# Patient Record
Sex: Male | Born: 1960 | Hispanic: No | Marital: Married | Smoking: Never smoker
Health system: Southern US, Community
[De-identification: ages and names within clinical notes are randomized; demographics above are authoritative.]

## PROBLEM LIST (undated history)

## (undated) DIAGNOSIS — K219 Gastro-esophageal reflux disease without esophagitis: Secondary | ICD-10-CM

## (undated) DIAGNOSIS — K259 Gastric ulcer, unspecified as acute or chronic, without hemorrhage or perforation: Secondary | ICD-10-CM

## (undated) DIAGNOSIS — J189 Pneumonia, unspecified organism: Secondary | ICD-10-CM

## (undated) HISTORY — DX: Gastro-esophageal reflux disease without esophagitis: K21.9

---

## 2000-12-19 HISTORY — PX: VEIN SURGERY: SHX48

## 2002-01-23 ENCOUNTER — Ambulatory Visit (HOSPITAL_COMMUNITY): Admission: RE | Admit: 2002-01-23 | Discharge: 2002-01-23 | Payer: Self-pay | Admitting: Gastroenterology

## 2002-01-28 ENCOUNTER — Ambulatory Visit (HOSPITAL_COMMUNITY): Admission: RE | Admit: 2002-01-28 | Discharge: 2002-01-28 | Payer: Self-pay | Admitting: Gastroenterology

## 2002-01-28 ENCOUNTER — Encounter: Payer: Self-pay | Admitting: Gastroenterology

## 2008-12-26 ENCOUNTER — Ambulatory Visit (HOSPITAL_COMMUNITY): Admission: RE | Admit: 2008-12-26 | Discharge: 2008-12-26 | Payer: Self-pay | Admitting: Family Medicine

## 2011-05-06 NOTE — Procedures (Signed)
[place]  Patient:    EFFREY, DAVIDOW Visit Number: [id number] MRN: [id number]          Service Type: END Location: ENDO Attending Physician:  Charna Elizabeth Dictated by:   Anselmo Rod, M.D. Proc. Date: 01/23/02 Admit Date:  01/23/2002   CC:         Gabriel Earing, M.D.   Procedure Report  DATE OF BIRTH:  [birth year]  REFERRING PHYSICIAN:  Gabriel Earing, M.D.  PROCEDURE PERFORMED:  Esophagogastroduodenoscopy with biopsies.  ENDOSCOPIST:  Anselmo Rod, M.D.  INSTRUMENT USED:  Olympus video panendoscope.  INDICATIONS FOR PROCEDURE:  History of hematemesis for the last three days in a 50 year old Hispanic male rule out esophagitis, gastritis, Mallory-Weiss tear, etc.  PREPROCEDURE PREPARATION:  Informed consent was procured from the patient. The patient was fasted for eight hours prior to the procedure and was advised to refrain from use of all nonsteroidals prior to the procedure.  PREPROCEDURE PHYSICAL:  The patient had stable vital signs.  Neck supple. Chest clear to auscultation.  S1, S2 regular.  Abdomen soft with normal abdominal bowel sounds.  Epigastric tenderness on palpation with guarding.  No rebound, no rigidity, no hepatosplenomegaly.  DESCRIPTION OF PROCEDURE:  The patient was placed in left lateral decubitus position and sedated with 60 mg of Demerol and 6 mg of Versed intravenously. Once the patient was adequately sedated and maintained on low-flow oxygen and continuous cardiac monitoring, the Olympus video panendoscope was advanced through the mouthpiece, over the tongue, into the esophagus under direct vision.  The entire esophagus appeared normal.  The scope was then advanced into the stomach.  There was patchy gastritis seen throughout the gastric mucosa, especially in the proximal half of the stomach.  There was a large hiatal hernia appreciated on retroflexion.  No frank ulcers, masses or polyps were  seen.  The proximal small bowel appeared normal and without lesions.  IMPRESSION: 1. Patchy gastritis, no ulcers seen. 2. Large hiatal hernia. 3. No evidence of esophagitis or Mallory-Weiss tear. 4. Normal proximal small bowel. 5. CLO test done.  RECOMMENDATIONS: 1. Continue Nexium. 2. Treat with antibiotic if CLO positive. 3. Avoid all nonsteroidals including aspirin. 4. Schedule HIDA scan for the patient, rule out biliary dyskinesia. 5. Outpatient follow-up in the next 10 days. Dictated by:   Anselmo Rod, M.D. Attending Physician:  Charna Elizabeth DD:  01/23/02 TD:  01/23/02 Job: [id number]

## 2014-03-11 ENCOUNTER — Encounter (INDEPENDENT_AMBULATORY_CARE_PROVIDER_SITE_OTHER): Payer: Self-pay | Admitting: General Surgery

## 2014-03-11 ENCOUNTER — Ambulatory Visit (INDEPENDENT_AMBULATORY_CARE_PROVIDER_SITE_OTHER): Payer: Self-pay | Admitting: General Surgery

## 2014-03-11 ENCOUNTER — Telehealth (INDEPENDENT_AMBULATORY_CARE_PROVIDER_SITE_OTHER): Payer: Self-pay | Admitting: General Surgery

## 2014-03-11 VITALS — BP 108/70 | HR 71 | Temp 97.0°F | Resp 16 | Ht 68.0 in | Wt 212.8 lb

## 2014-03-11 DIAGNOSIS — K603 Anal fistula, unspecified: Secondary | ICD-10-CM

## 2014-03-11 NOTE — Telephone Encounter (Signed)
Patient met with surgery scheduling, given financial responsibilities, patient will call back to schedule

## 2014-03-11 NOTE — Progress Notes (Signed)
Patient ID: Rodney Lester, male   DOB: 06/28/1961, 53 y.o.   MRN: [id number]  No chief complaint on file.   HPI Rodney Lester is a 53 y.o. male.  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 Mayford KnifeWilliams. The patient states he's had dysuria for approximately 2 years. He is being given and problem on and off. He states that there is some purulence is expressed at times. The patient's main concern is this could be cancer.  HPI  Past Medical History  Diagnosis Date  . GERD (gastroesophageal reflux disease)     No past surgical history on file.  No family history on file.  Social History History  Substance Use Topics  . Smoking status: Never Smoker   . Smokeless tobacco: Not on file  . Alcohol Use: Not on file    No Known Allergies  Current Outpatient Prescriptions  Medication Sig Dispense Refill  . OMEPRAZOLE PO Take by mouth.      . sulfamethoxazole-trimethoprim (BACTRIM DS) 800-160 MG per tablet Take 1 tablet by mouth 2 (two) times daily.      . traMADol (ULTRAM) 50 MG tablet Take 50 mg by mouth every 6 (six) hours as needed.       No current facility-administered medications for this visit.    Review of Systems Review of Systems  Constitutional: Negative.   HENT: Negative.   Eyes: Negative.   Respiratory: Negative.   Cardiovascular: Negative.   Gastrointestinal: Negative.   Endocrine: Negative.   Neurological: Negative.     Blood pressure 108/70, pulse 71, temperature 97 F (36.1 C), temperature source Temporal, resp. rate 16, height 5\' 8"  (1.727 m), weight 212 lb 12.8 oz (96.525 kg).  Physical Exam Physical Exam  Constitutional: He is oriented to person, place, and time. He appears well-developed and well-nourished.  HENT:  Head: Normocephalic and atraumatic.  Eyes: Conjunctivae and EOM are normal. Pupils are equal, round, and reactive to light.  Neck: Normal range of motion. Neck  supple.  Cardiovascular: Normal rate, regular rhythm and normal heart sounds.   Pulmonary/Chest: Effort normal and breath sounds normal.  Abdominal: Soft. Bowel sounds are normal. He exhibits no distension and no mass. There is no tenderness. There is no rebound and no guarding.  Genitourinary:     Musculoskeletal: Normal range of motion.  Neurological: He is alert and oriented to person, place, and time.  Skin: Skin is warm and dry.    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 We'll proceed to the operating room for an exam under anesthesia possible fistulotomy possible seton placement. 2. I Discussed the procedure with the patient including the possible risks and benefits to include but not limited to: Infection, bleeding, damage to structures, possible incontinence, and possible need to return to the OR for further procedures. Patient voiced understanding and wishes to proceed.        Marigene Ehlersamirez Jr., Cleola Perryman 03/11/2014, 1:53 PM

## 2014-04-02 ENCOUNTER — Encounter (INDEPENDENT_AMBULATORY_CARE_PROVIDER_SITE_OTHER): Payer: Self-pay

## 2015-11-03 ENCOUNTER — Ambulatory Visit: Payer: Self-pay | Admitting: Gastroenterology

## 2017-11-25 ENCOUNTER — Emergency Department (HOSPITAL_COMMUNITY): Payer: Self-pay

## 2017-11-25 ENCOUNTER — Encounter (HOSPITAL_COMMUNITY): Payer: Self-pay | Admitting: Emergency Medicine

## 2017-11-25 ENCOUNTER — Emergency Department (HOSPITAL_COMMUNITY)
Admission: EM | Admit: 2017-11-25 | Discharge: 2017-11-25 | Disposition: A | Discharge status: Home / Self Care | Payer: Self-pay | Attending: Emergency Medicine | Admitting: Emergency Medicine

## 2017-11-25 DIAGNOSIS — J40 Bronchitis, not specified as acute or chronic: Secondary | ICD-10-CM

## 2017-11-25 DIAGNOSIS — R55 Syncope and collapse: Secondary | ICD-10-CM | POA: Insufficient documentation

## 2017-11-25 DIAGNOSIS — R739 Hyperglycemia, unspecified: Secondary | ICD-10-CM | POA: Insufficient documentation

## 2017-11-25 DIAGNOSIS — Z79899 Other long term (current) drug therapy: Secondary | ICD-10-CM | POA: Insufficient documentation

## 2017-11-25 DIAGNOSIS — N189 Chronic kidney disease, unspecified: Secondary | ICD-10-CM | POA: Insufficient documentation

## 2017-11-25 DIAGNOSIS — R05 Cough: Secondary | ICD-10-CM | POA: Insufficient documentation

## 2017-11-25 LAB — CBC WITH DIFFERENTIAL/PLATELET
BASOS ABS: 0 10*3/uL (ref 0.0–0.1)
BASOS PCT: 0 %
EOS ABS: 0 10*3/uL (ref 0.0–0.7)
Eosinophils Relative: 0 %
HCT: 41.3 % (ref 39.0–52.0)
HEMOGLOBIN: 14.1 g/dL (ref 13.0–17.0)
Lymphocytes Relative: 12 %
Lymphs Abs: 1.5 10*3/uL (ref 0.7–4.0)
MCH: 30 pg (ref 26.0–34.0)
MCHC: 34.1 g/dL (ref 30.0–36.0)
MCV: 87.9 fL (ref 78.0–100.0)
Monocytes Absolute: 1.2 10*3/uL — ABNORMAL HIGH (ref 0.1–1.0)
Monocytes Relative: 10 %
NEUTROS PCT: 78 %
Neutro Abs: 9.5 10*3/uL — ABNORMAL HIGH (ref 1.7–7.7)
Platelets: 299 10*3/uL (ref 150–400)
RBC: 4.7 MIL/uL (ref 4.22–5.81)
RDW: 12.7 % (ref 11.5–15.5)
WBC: 12.2 10*3/uL — AB (ref 4.0–10.5)

## 2017-11-25 LAB — BASIC METABOLIC PANEL
ANION GAP: 14 (ref 5–15)
BUN: 15 mg/dL (ref 6–20)
CALCIUM: 9 mg/dL (ref 8.9–10.3)
CO2: 20 mmol/L — ABNORMAL LOW (ref 22–32)
CREATININE: 1.26 mg/dL — AB (ref 0.61–1.24)
Chloride: 103 mmol/L (ref 101–111)
GFR calc non Af Amer: >60 mL/min (ref >60)
Glucose, Bld: 167 mg/dL — ABNORMAL HIGH (ref 65–99)
Potassium: 3.7 mmol/L (ref 3.5–5.1)
SODIUM: 137 mmol/L (ref 135–145)

## 2017-11-25 LAB — I-STAT TROPONIN, ED: Troponin i, poc: 0 ng/mL (ref 0.00–0.08)

## 2017-11-25 LAB — D-DIMER, QUANTITATIVE: D-Dimer, Quant: 0.53 ug/mL-FEU — ABNORMAL HIGH (ref 0.00–0.50)

## 2017-11-25 MED ORDER — ALBUTEROL SULFATE HFA 108 (90 BASE) MCG/ACT IN AERS
2.0000 | INHALATION_SPRAY | Freq: Once | RESPIRATORY_TRACT | Status: AC
  Administered 2017-11-25: 2 via RESPIRATORY_TRACT
  Filled 2017-11-25: qty 6.7

## 2017-11-25 MED ORDER — IOPAMIDOL (ISOVUE-370) INJECTION 76%
INTRAVENOUS | Status: AC
  Filled 2017-11-25: qty 100

## 2017-11-25 MED ORDER — IOPAMIDOL (ISOVUE-370) INJECTION 76%
100.0000 mL | Freq: Once | INTRAVENOUS | Status: AC | PRN
  Administered 2017-11-25: 100 mL via INTRAVENOUS

## 2017-11-25 NOTE — ED Provider Notes (Signed)
7:01 PM I personally reviewed the imaging tests through PACS system I reviewed available ER/hospitalization records through the EMR  CT scan with bronchitis and possibly developing pneumonia.  Patient is only 2 days out of 10 into his Levaquin.  He will continue Levaquin.  Metered-dose inhaler for cough.  Overall well-appearing.  Discharged home in good condition.  Vital signs stable.  No hypoxia.   Azalia Bilisampos, Armonte Tortorella, MD 11/25/17 Mikle [id number]

## 2017-11-25 NOTE — ED Triage Notes (Signed)
Pt and family per interpreter, pt diagnosed with cough recently, reports that they were near this facility when pt started to cough so violently that he had LOC and emesis. Pt was transferred from EMS bay to [facility] for eval, Dr Shela CommonsJ at bedside. Pt in NAD and VSS.

## 2017-11-25 NOTE — ED Provider Notes (Signed)
[facility]-EMERGENCY DEPT Provider Note   CSN: [id number] Arrival date & time: 11/25/17  1411   History is obtained from professional medical interpreter patient speaks no [place]  History   Chief Complaint Chief Complaint  Patient presents with  . Cough  . Loss of Consciousness    HPI Rodney Lester is a 56 y.o. male.  She has been coughing for the past 15 days he has had 3 episodes of syncope with severe cough.  He complains of left anterior non-radiating chest pain with cough only.  He is presently pain-free his last episode of syncope was immediately prior to coming here.  He denies any fever.  Denies shortness of breath.  He was seen at a clinic recently prescriptions written for albuterol nebulizer solution,Cheratrussin AC levofloxacin prednisone Pantoprazole, sucralfate denies headache denies abdominal pain denies other associated symptoms  HPI  Past Medical History:  Diagnosis Date  . GERD (gastroesophageal reflux disease)     There are no active problems to display for this patient.   Past Surgical History:  Procedure Laterality Date  . VEIN SURGERY  2002       Home Medications    Prior to Admission medications   Medication Sig Start Date End Date Taking? Authorizing Provider  OMEPRAZOLE PO Take by mouth.    [provider]  sulfamethoxazole-trimethoprim (BACTRIM DS) 800-160 MG per tablet Take 1 tablet by mouth 2 (two) times daily.    [provider]  traMADol (ULTRAM) 50 MG tablet Take 50 mg by mouth every 6 (six) hours as needed.    [provider]    Family History No family history on file.  Social History Social History   Tobacco Use  . Smoking status: Never Smoker  . Smokeless tobacco: Never Used  Substance Use Topics  . Alcohol use: No    Frequency: Never  . Drug use: No     Allergies   Patient has no known allergies.   Review of Systems Review of Systems  Constitutional: Negative.     HENT: Negative.   Respiratory: Positive for cough.   Cardiovascular: Positive for chest pain.  Gastrointestinal: Negative.   Musculoskeletal: Negative.   Skin: Negative.   Neurological: Negative.   Psychiatric/Behavioral: Negative.   All other systems reviewed and are negative.    Physical Exam Updated Vital Signs BP (!) 145/71 (BP Location: Right Arm)   Pulse 86   Temp 98.1 F (36.7 C) (Oral)   Resp 12   Ht 5\' 6"  (1.676 m)   Wt 96.2 kg (212 lb)   SpO2 98%   BMI 34.22 kg/m   Physical Exam  Constitutional: He appears well-developed and well-nourished.  HENT:  Head: Normocephalic and atraumatic.  Eyes: Conjunctivae are normal. Pupils are equal, round, and reactive to light.  Neck: Neck supple. No tracheal deviation present. No thyromegaly present.  Cardiovascular: Normal rate and regular rhythm.  No murmur heard. Pulmonary/Chest: Effort normal and breath sounds normal.  Abdominal: Soft. Bowel sounds are normal. He exhibits no distension. There is no tenderness.  Musculoskeletal: Normal range of motion. He exhibits no edema or tenderness.  Neurological: He is alert. Coordination normal.  Skin: Skin is warm and dry. No rash noted.  Psychiatric: He has a normal mood and affect.  Nursing note and vitals reviewed.    ED Treatments / Results  Labs (all labs ordered are listed, but only abnormal results are displayed) Labs Reviewed  D-DIMER, QUANTITATIVE (NOT AT [facility])  BASIC  METABOLIC PANEL  CBC WITH DIFFERENTIAL/PLATELET  I-STAT TROPONIN, ED    EKG  EKG Interpretation  Date/Time:  Saturday November 25 2017 14:13:11 EST Ventricular Rate:  93 PR Interval:    QRS Duration: 116 QT Interval:  355 QTC Calculation: 442 R Axis:   60 Text Interpretation:  Sinus rhythm Incomplete right bundle branch block No old tracing to compare Confirmed by BrillionJacubowitz, Doreatha MartinSam [phone]([id number]) on 11/25/2017 2:38:52 PM     Chest x-ray reviewed by me  Radiology No results found. Results for  orders placed or performed during the hospital encounter of 11/25/17  D-dimer, quantitative (not at [facility])  Result Value Ref Range   D-Dimer, Quant 0.53 (H) 0.00 - 0.50 ug/mL-FEU  Basic metabolic panel  Result Value Ref Range   Sodium 137 135 - 145 mmol/L   Potassium 3.7 3.5 - 5.1 mmol/L   Chloride 103 101 - 111 mmol/L   CO2 20 (L) 22 - 32 mmol/L   Glucose, Bld 167 (H) 65 - 99 mg/dL   BUN 15 6 - 20 mg/dL   Creatinine, Ser 6.041.26 (H) 0.61 - 1.24 mg/dL   Calcium 9.0 8.9 - 54.010.3 mg/dL   GFR calc non Af Amer >60 >60 mL/min   GFR calc Af Amer >60 >60 mL/min   Anion gap 14 5 - 15  CBC with Differential/Platelet  Result Value Ref Range   WBC 12.2 (H) 4.0 - 10.5 K/uL   RBC 4.70 4.22 - 5.81 MIL/uL   Hemoglobin 14.1 13.0 - 17.0 g/dL   HCT 98.141.3 19.139.0 - 47.852.0 %   MCV 87.9 78.0 - 100.0 fL   MCH 30.0 26.0 - 34.0 pg   MCHC 34.1 30.0 - 36.0 g/dL   RDW 29.512.7 62.111.5 - 30.815.5 %   Platelets 299 150 - 400 K/uL   Neutrophils Relative % 78 %   Neutro Abs 9.5 (H) 1.7 - 7.7 K/uL   Lymphocytes Relative 12 %   Lymphs Abs 1.5 0.7 - 4.0 K/uL   Monocytes Relative 10 %   Monocytes Absolute 1.2 (H) 0.1 - 1.0 K/uL   Eosinophils Relative 0 %   Eosinophils Absolute 0.0 0.0 - 0.7 K/uL   Basophils Relative 0 %   Basophils Absolute 0.0 0.0 - 0.1 K/uL  I-Stat Troponin, ED (not at [facility])  Result Value Ref Range   Troponin i, poc 0.00 0.00 - 0.08 ng/mL   Comment 3           Dg Chest Port 1 View  Result Date: 11/25/2017 CLINICAL DATA:  Cough EXAM: PORTABLE CHEST 1 VIEW COMPARISON:  None. FINDINGS: There is no edema or consolidation. Heart is upper normal in size with pulmonary vascularity within normal limits. No adenopathy. No bone lesions. IMPRESSION: No edema or consolidation.  Heart upper normal in size. Electronically Signed   By: Bretta BangWilliam  Woodruff III M.D.   On: 11/25/2017 14:47   Procedures Procedures (including critical care time)  Medications Ordered in ED Medications - No data to display   Initial  Impression / Assessment and Plan / ED Course  I have reviewed the triage vital signs and the nursing notes.  Pertinent labs & imaging results that were available during my care of the patient were reviewed by me and considered in my medical decision making (see chart for details).    Low pretest clinical probability for pulmonary embolism however patient  has mildly elevated d-dimer and had syncope therefore will obtain CT angiogram of chest to rule out pulmonary embolism. Symptoms  consistent with cough syncope. Pt signed out to Dr. Patria Maneampos 440 pm  Final Clinical Impressions(s) / ED Diagnoses  Diagnoses #1 cough syncope #2 renal insufficiency #3 hyperglycemia Final diagnoses:  None    ED Discharge Orders    None       Doug SouJacubowitz, Iden Stripling, MD 11/25/17 1644

## 2017-11-25 NOTE — ED Notes (Signed)
Pt ambulates w/o difficulty to BR and back to room.

## 2019-01-26 ENCOUNTER — Observation Stay (HOSPITAL_COMMUNITY): Payer: Self-pay | Admitting: Certified Registered Nurse Anesthetist

## 2019-01-26 ENCOUNTER — Encounter (HOSPITAL_COMMUNITY): Payer: Self-pay | Admitting: Emergency Medicine

## 2019-01-26 ENCOUNTER — Observation Stay (HOSPITAL_COMMUNITY)
Admission: EM | Admit: 2019-01-26 | Discharge: 2019-01-26 | Disposition: A | Discharge status: Home / Self Care | Payer: Self-pay | Attending: Family Medicine | Admitting: Family Medicine

## 2019-01-26 ENCOUNTER — Encounter (HOSPITAL_COMMUNITY)
Admission: EM | Disposition: A | Discharge status: Home / Self Care | Payer: Self-pay | Source: Home / Self Care | Attending: Emergency Medicine

## 2019-01-26 ENCOUNTER — Other Ambulatory Visit: Payer: Self-pay

## 2019-01-26 ENCOUNTER — Emergency Department (HOSPITAL_COMMUNITY): Payer: Self-pay

## 2019-01-26 DIAGNOSIS — K2100 Gastro-esophageal reflux disease with esophagitis, without bleeding: Secondary | ICD-10-CM | POA: Diagnosis present

## 2019-01-26 DIAGNOSIS — K250 Acute gastric ulcer with hemorrhage: Secondary | ICD-10-CM

## 2019-01-26 DIAGNOSIS — R109 Unspecified abdominal pain: Secondary | ICD-10-CM | POA: Diagnosis present

## 2019-01-26 DIAGNOSIS — K449 Diaphragmatic hernia without obstruction or gangrene: Secondary | ICD-10-CM | POA: Insufficient documentation

## 2019-01-26 DIAGNOSIS — K2950 Unspecified chronic gastritis without bleeding: Secondary | ICD-10-CM | POA: Insufficient documentation

## 2019-01-26 DIAGNOSIS — Z8711 Personal history of peptic ulcer disease: Secondary | ICD-10-CM | POA: Insufficient documentation

## 2019-01-26 DIAGNOSIS — R1084 Generalized abdominal pain: Secondary | ICD-10-CM

## 2019-01-26 DIAGNOSIS — G8929 Other chronic pain: Secondary | ICD-10-CM

## 2019-01-26 DIAGNOSIS — K920 Hematemesis: Secondary | ICD-10-CM | POA: Insufficient documentation

## 2019-01-26 DIAGNOSIS — Z79899 Other long term (current) drug therapy: Secondary | ICD-10-CM | POA: Insufficient documentation

## 2019-01-26 DIAGNOSIS — R1013 Epigastric pain: Secondary | ICD-10-CM

## 2019-01-26 HISTORY — PX: ESOPHAGOGASTRODUODENOSCOPY (EGD) WITH PROPOFOL: SHX5813

## 2019-01-26 HISTORY — DX: Gastric ulcer, unspecified as acute or chronic, without hemorrhage or perforation: K25.9

## 2019-01-26 HISTORY — PX: BIOPSY: SHX5522

## 2019-01-26 LAB — COMPREHENSIVE METABOLIC PANEL
ALT: 19 U/L (ref 0–44)
ANION GAP: 14 (ref 5–15)
AST: 19 U/L (ref 15–41)
Albumin: 4 g/dL (ref 3.5–5.0)
Alkaline Phosphatase: 71 U/L (ref 38–126)
BILIRUBIN TOTAL: 0.5 mg/dL (ref 0.3–1.2)
BUN: 21 mg/dL — ABNORMAL HIGH (ref 6–20)
CALCIUM: 9.1 mg/dL (ref 8.9–10.3)
CO2: 22 mmol/L (ref 22–32)
CREATININE: 1.11 mg/dL (ref 0.61–1.24)
Chloride: 103 mmol/L (ref 98–111)
Glucose, Bld: 117 mg/dL — ABNORMAL HIGH (ref 70–99)
Potassium: 4.3 mmol/L (ref 3.5–5.1)
Sodium: 139 mmol/L (ref 135–145)
TOTAL PROTEIN: 7.2 g/dL (ref 6.5–8.1)

## 2019-01-26 LAB — CBC
HCT: 46.5 % (ref 39.0–52.0)
Hemoglobin: 15.5 g/dL (ref 13.0–17.0)
MCH: 30.5 pg (ref 26.0–34.0)
MCHC: 33.3 g/dL (ref 30.0–36.0)
MCV: 91.5 fL (ref 80.0–100.0)
PLATELETS: 266 10*3/uL (ref 150–400)
RBC: 5.08 MIL/uL (ref 4.22–5.81)
RDW: 12.7 % (ref 11.5–15.5)
WBC: 7.4 10*3/uL (ref 4.0–10.5)
nRBC: 0 % (ref 0.0–0.2)

## 2019-01-26 LAB — URINALYSIS, ROUTINE W REFLEX MICROSCOPIC
Bilirubin Urine: NEGATIVE
GLUCOSE, UA: NEGATIVE mg/dL
Hgb urine dipstick: NEGATIVE
KETONES UR: NEGATIVE mg/dL
LEUKOCYTES UA: NEGATIVE
Nitrite: NEGATIVE
PROTEIN: NEGATIVE mg/dL
Specific Gravity, Urine: 1.029 (ref 1.005–1.030)
pH: 7 (ref 5.0–8.0)

## 2019-01-26 LAB — HEMOGLOBIN AND HEMATOCRIT, BLOOD
HCT: 44 % (ref 39.0–52.0)
Hemoglobin: 14.5 g/dL (ref 13.0–17.0)

## 2019-01-26 LAB — PROTIME-INR
INR: 1.01
PROTHROMBIN TIME: 13.2 s (ref 11.4–15.2)

## 2019-01-26 LAB — LIPASE, BLOOD: Lipase: 58 U/L — ABNORMAL HIGH (ref 11–51)

## 2019-01-26 SURGERY — ESOPHAGOGASTRODUODENOSCOPY (EGD) WITH PROPOFOL
Anesthesia: Monitor Anesthesia Care

## 2019-01-26 MED ORDER — MORPHINE SULFATE (PF) 4 MG/ML IV SOLN
6.0000 mg | Freq: Once | INTRAVENOUS | Status: AC
  Administered 2019-01-26: 6 mg via INTRAVENOUS
  Filled 2019-01-26: qty 2

## 2019-01-26 MED ORDER — ONDANSETRON HCL 4 MG/2ML IJ SOLN
4.0000 mg | Freq: Three times a day (TID) | INTRAMUSCULAR | Status: DC | PRN
  Administered 2019-01-26: 4 mg via INTRAVENOUS
  Filled 2019-01-26: qty 2

## 2019-01-26 MED ORDER — ACETAMINOPHEN 325 MG PO TABS: 650.0000 mg | ORAL_TABLET | Freq: Four times a day (QID) | ORAL | 0 refills | Status: AC | PRN

## 2019-01-26 MED ORDER — PANTOPRAZOLE SODIUM 40 MG PO TBEC
40.0000 mg | DELAYED_RELEASE_TABLET | Freq: Two times a day (BID) | ORAL | Status: DC
  Administered 2019-01-26: 40 mg via ORAL
  Filled 2019-01-26: qty 1

## 2019-01-26 MED ORDER — PANTOPRAZOLE SODIUM 40 MG IV SOLR
40.0000 mg | Freq: Every day | INTRAVENOUS | Status: DC
  Administered 2019-01-26: 40 mg via INTRAVENOUS
  Filled 2019-01-26: qty 40

## 2019-01-26 MED ORDER — PROPOFOL 10 MG/ML IV BOLUS
INTRAVENOUS | Status: DC | PRN
  Administered 2019-01-26 (×3): 20 mg via INTRAVENOUS

## 2019-01-26 MED ORDER — MORPHINE SULFATE (PF) 2 MG/ML IV SOLN: 2.0000 mg | INTRAVENOUS | Status: DC | PRN

## 2019-01-26 MED ORDER — PANTOPRAZOLE SODIUM 40 MG IV SOLR
40.0000 mg | Freq: Once | INTRAVENOUS | Status: AC
  Administered 2019-01-26: 40 mg via INTRAVENOUS
  Filled 2019-01-26: qty 40

## 2019-01-26 MED ORDER — SODIUM CHLORIDE 0.9% FLUSH: 3.0000 mL | INTRAVENOUS | Status: DC | PRN

## 2019-01-26 MED ORDER — LACTATED RINGERS IV SOLN
INTRAVENOUS | Status: DC | PRN
  Administered 2019-01-26: 10:00:00 via INTRAVENOUS

## 2019-01-26 MED ORDER — MORPHINE SULFATE (PF) 4 MG/ML IV SOLN: 4.0000 mg | Freq: Once | INTRAVENOUS | Status: DC

## 2019-01-26 MED ORDER — DEXTROSE-NACL 5-0.45 % IV SOLN
INTRAVENOUS | Status: DC
  Administered 2019-01-26: 13:00:00 via INTRAVENOUS

## 2019-01-26 MED ORDER — ALBUTEROL SULFATE (2.5 MG/3ML) 0.083% IN NEBU: 2.5000 mg | INHALATION_SOLUTION | RESPIRATORY_TRACT | Status: DC | PRN

## 2019-01-26 MED ORDER — ACETAMINOPHEN 325 MG PO TABS: 650.0000 mg | ORAL_TABLET | Freq: Four times a day (QID) | ORAL | Status: DC | PRN

## 2019-01-26 MED ORDER — SODIUM CHLORIDE 0.9 % IV SOLN: 250.0000 mL | INTRAVENOUS | Status: DC | PRN

## 2019-01-26 MED ORDER — LIDOCAINE VISCOUS HCL 2 % MT SOLN
15.0000 mL | Freq: Once | OROMUCOSAL | Status: AC
  Administered 2019-01-26: 15 mL via ORAL
  Filled 2019-01-26: qty 15

## 2019-01-26 MED ORDER — OXYCODONE HCL 5 MG PO TABS
5.0000 mg | ORAL_TABLET | ORAL | Status: DC | PRN
  Administered 2019-01-26: 5 mg via ORAL
  Filled 2019-01-26: qty 1

## 2019-01-26 MED ORDER — TRAZODONE HCL 50 MG PO TABS: 50.0000 mg | ORAL_TABLET | Freq: Every evening | ORAL | Status: DC | PRN

## 2019-01-26 MED ORDER — ONDANSETRON HCL 4 MG PO TABS: 4.0000 mg | ORAL_TABLET | Freq: Four times a day (QID) | ORAL | 0 refills | Status: AC | PRN

## 2019-01-26 MED ORDER — IOHEXOL 300 MG/ML  SOLN
100.0000 mL | Freq: Once | INTRAMUSCULAR | Status: AC | PRN
  Administered 2019-01-26: 100 mL via INTRAVENOUS

## 2019-01-26 MED ORDER — PANTOPRAZOLE SODIUM 40 MG PO TBEC: 40.0000 mg | DELAYED_RELEASE_TABLET | Freq: Every day | ORAL | Status: DC

## 2019-01-26 MED ORDER — ACETAMINOPHEN 650 MG RE SUPP: 650.0000 mg | Freq: Four times a day (QID) | RECTAL | Status: DC | PRN

## 2019-01-26 MED ORDER — ONDANSETRON HCL 4 MG/2ML IJ SOLN: 4.0000 mg | Freq: Four times a day (QID) | INTRAMUSCULAR | Status: DC | PRN

## 2019-01-26 MED ORDER — METOCLOPRAMIDE HCL 5 MG/ML IJ SOLN: 10.0000 mg | Freq: Once | INTRAMUSCULAR | Status: DC

## 2019-01-26 MED ORDER — DEXTROSE-NACL 5-0.45 % IV SOLN
INTRAVENOUS | Status: DC
  Administered 2019-01-26: 08:00:00 via INTRAVENOUS

## 2019-01-26 MED ORDER — ALUM & MAG HYDROXIDE-SIMETH 200-200-20 MG/5ML PO SUSP
30.0000 mL | Freq: Once | ORAL | Status: AC
  Administered 2019-01-26: 30 mL via ORAL
  Filled 2019-01-26: qty 30

## 2019-01-26 MED ORDER — PROPOFOL 500 MG/50ML IV EMUL
INTRAVENOUS | Status: DC | PRN
  Administered 2019-01-26: 100 ug/kg/min via INTRAVENOUS

## 2019-01-26 MED ORDER — PANTOPRAZOLE SODIUM 40 MG IV SOLR: 40.0000 mg | Freq: Two times a day (BID) | INTRAVENOUS | Status: DC

## 2019-01-26 MED ORDER — SODIUM CHLORIDE 0.9% FLUSH: 3.0000 mL | Freq: Two times a day (BID) | INTRAVENOUS | Status: DC

## 2019-01-26 MED ORDER — ONDANSETRON HCL 4 MG PO TABS: 4.0000 mg | ORAL_TABLET | Freq: Four times a day (QID) | ORAL | Status: DC | PRN

## 2019-01-26 MED ORDER — ONDANSETRON HCL 4 MG/2ML IJ SOLN
4.0000 mg | Freq: Once | INTRAMUSCULAR | Status: AC
  Administered 2019-01-26: 4 mg via INTRAVENOUS
  Filled 2019-01-26: qty 2

## 2019-01-26 MED ORDER — SODIUM CHLORIDE 0.9% FLUSH
3.0000 mL | Freq: Once | INTRAVENOUS | Status: AC
  Administered 2019-01-26: 3 mL via INTRAVENOUS

## 2019-01-26 MED ORDER — DIPHENHYDRAMINE HCL 50 MG/ML IJ SOLN: 25.0000 mg | Freq: Once | INTRAMUSCULAR | Status: DC

## 2019-01-26 MED ORDER — CALCIUM CARBONATE ANTACID 500 MG PO CHEW: 1.0000 | CHEWABLE_TABLET | Freq: Two times a day (BID) | ORAL | 3 refills | Status: AC | PRN

## 2019-01-26 MED ORDER — PANTOPRAZOLE SODIUM 40 MG PO TBEC: 40.0000 mg | DELAYED_RELEASE_TABLET | Freq: Two times a day (BID) | ORAL | 4 refills | Status: AC

## 2019-01-26 MED ORDER — POLYETHYLENE GLYCOL 3350 17 G PO PACK: 17.0000 g | PACK | Freq: Every day | ORAL | Status: DC | PRN

## 2019-01-26 MED ORDER — SODIUM CHLORIDE 0.9 % IV SOLN: INTRAVENOUS | Status: DC

## 2019-01-26 SURGICAL SUPPLY — 15 items

## 2019-01-26 NOTE — Progress Notes (Signed)
Patient discharged to home. Discharge paperwork reviewed with patient and patient's daughter. Verbalizes understanding of all discharge instructions including discharge medications and reasons to follow up with the doctor. AVS provided to patient in spanish.

## 2019-01-26 NOTE — Transfer of Care (Signed)
Immediate Anesthesia Transfer of Care Note  Patient: Rodney Lester  Procedure(s) Performed: ESOPHAGOGASTRODUODENOSCOPY (EGD) WITH PROPOFOL (N/A ) BIOPSY  Patient Location: Endoscopy Unit  Anesthesia Type:MAC  Level of Consciousness: awake, alert  and oriented  Airway & Oxygen Therapy: Patient Spontanous Breathing  Post-op Assessment: Report given to RN and Post -op Vital signs reviewed and stable  Post vital signs: Reviewed and stable  Last Vitals:  Vitals Value Taken Time  BP    Temp    Pulse    Resp    SpO2      Last Pain:  Vitals:   01/26/19 0954  TempSrc: Oral  PainSc: 0-No pain         Complications: No apparent anesthesia complications

## 2019-01-26 NOTE — Progress Notes (Addendum)
Received patient from ED. Self ambulated from wheelchair to bed. Patient alert and oriented, no complaints of pain at this time. Patient oriented to room/call bell. Accompanied by daughter who speaks english. Will continue to monitor.

## 2019-01-26 NOTE — ED Notes (Signed)
Pt declining any additional pain medication at the moment. Will continue to monitor

## 2019-01-26 NOTE — Consult Note (Addendum)
Consultation  Referring Provider: [facility] Mariea Clonts/Emokpae Primary Care Physician:  Patient, No Pcp Per Primary Gastroenterologist:  none  Reason for Consultation: Nausea vomiting abdominal pain and coffee-ground emesis   HPI: Rodney Lester is a 58 y.o. Hispanic male, who speaks very little [place].  Patient was admitted through the emergency room last evening after presenting with several day history of epigastric pain which he describes as twisting in nature.  This is been associated with intermittent nausea and vomiting especially with any significant p.o. intake.  He says about 10 days ago he vomited up a large amount of material that was black.  Since then most episodes of emesis have contained smaller amounts of black material.  He denies any dysphagia or done aphasia burn or indigestion.  He has not noted any melena or hematochezia. He denies any alcohol or aspirin or NSAID use.  He believes that about 10 years ago he was admitted with similar symptoms, had an endoscopy and was found to have ulcers.  He does not remember the gastroenterologist and has not seen anybody GI as an outpatient. He did get started on OTC Protonix sometime within the past week.  Patient is otherwise generally healthy.  No known significant chronic medical problems.  Evaluation in the ER last night with CT scan of the abdomen and pelvis showing a moderate sized hiatal hernia and otherwise negative study Labs pertinent for hemoglobin of 15, and BUN 21 LFTs normal.  He has been hemodynamically stable   Past Medical History:  Diagnosis Date  . GERD (gastroesophageal reflux disease)   . Multiple gastric ulcers     Past Surgical History:  Procedure Laterality Date  . VEIN SURGERY  2002    Prior to Admission medications   Medication Sig Start Date End Date Taking? Authorizing Provider  pantoprazole (PROTONIX) 40 MG tablet Take 40 mg by mouth daily.   Yes [provider]    Current  Facility-Administered Medications  Medication Dose Route Frequency Provider Last Rate Last Dose  . dextrose 5 %-0.45 % sodium chloride infusion   Intravenous Continuous Shon HaleEmokpae, Courage, MD 125 mL/hr at 01/26/19 0756    . diphenhydrAMINE (BENADRYL) injection 25 mg  25 mg Intravenous Once Shaune PollackIsaacs, Cameron, MD      . metoCLOPramide (REGLAN) injection 10 mg  10 mg Intravenous Once Shaune PollackIsaacs, Cameron, MD      . morphine 2 MG/ML injection 2 mg  2 mg Intravenous Q4H PRN Lorretta HarpNiu, Xilin, MD      . morphine 4 MG/ML injection 4 mg  4 mg Intravenous Once Shaune PollackIsaacs, Cameron, MD      . ondansetron [facility](ZOFRAN) injection 4 mg  4 mg Intravenous Q8H PRN Lorretta HarpNiu, Xilin, MD      . pantoprazole (PROTONIX) injection 40 mg  40 mg Intravenous Daily Emokpae, Courage, MD   40 mg at 01/26/19 0800    Allergies as of 01/26/2019  . (No Known Allergies)    No family history on file.  Social History   Socioeconomic History  . Marital status: Married    Spouse name: Not on file  . Number of children: Not on file  . Years of education: Not on file  . Highest education level: Not on file  Occupational History  . Not on file  Social Needs  . Financial resource strain: Not on file  . Food insecurity:    Worry: Not on file    Inability: Not on file  . Transportation needs:  Medical: Not on file    Non-medical: Not on file  Tobacco Use  . Smoking status: Never Smoker  . Smokeless tobacco: Never Used  Substance and Sexual Activity  . Alcohol use: No    Frequency: Never  . Drug use: No  . Sexual activity: Not on file  Lifestyle  . Physical activity:    Days per week: Not on file    Minutes per session: Not on file  . Stress: Not on file  Relationships  . Social connections:    Talks on phone: Not on file    Gets together: Not on file    Attends religious service: Not on file    Active member of club or organization: Not on file    Attends meetings of clubs or organizations: Not on file    Relationship status: Not on  file  . Intimate partner violence:    Fear of current or ex partner: Not on file    Emotionally abused: Not on file    Physically abused: Not on file    Forced sexual activity: Not on file  Other Topics Concern  . Not on file  Social History Narrative  . Not on file    Review of Systems: Pertinent positive and negative review of systems were noted in the above HPI section.  All other review of systems was otherwise negative.  Physical Exam: Vital signs in last 24 hours: Temp:  [97.8 F (36.6 C)-98.2 F (36.8 C)] 97.8 F (36.6 C) (02/08 0750) Pulse Rate:  [52-63] 61 (02/08 0750) Resp:  [16-18] 17 (02/08 0750) BP: (107-129)/(76-97) 120/83 (02/08 0750) SpO2:  [95 %-100 %] 96 % (02/08 0750)   General:   Alert,  Well-developed, well-nourished, Hispanic male pleasant and cooperative in NAD.  Daughter at bedside Head:  Normocephalic and atraumatic. Eyes:  Sclera clear, no icterus.   Conjunctiva pink. Ears:  Normal auditory acuity. Nose:  No deformity, discharge,  or lesions. Mouth:  No deformity or lesions.   Neck:  Supple; no masses or thyromegaly. Lungs:  Clear throughout to auscultation.   No wheezes, crackles, or rhonchi. Heart:  Regular rate and rhythm; no murmurs, clicks, rubs,  or gallops. Abdomen:  Soft, mildly tender epigastrium, BS active,nonpalp mass or hsm.   Rectal:  Deferred  Msk:  Symmetrical without gross deformities. . Pulses:  Normal pulses noted. Extremities:  Without clubbing or edema. Neurologic:  Alert and  oriented x4;  grossly normal neurologically. Skin:  Intact without significant lesions or rashes.. Psych:  Alert and cooperative. Normal mood and affect.  Intake/Output from previous day: 02/07 0701 - 02/08 0700 In: 3 [I.V.:3] Out: -  Intake/Output this shift: No intake/output data recorded.  Lab Results: Recent Labs    01/26/19 0231  WBC 7.4  HGB 15.5  HCT 46.5  PLT 266   BMET Recent Labs    01/26/19 0231  NA 139  K 4.3  CL 103  CO2  22  GLUCOSE 117*  BUN 21*  CREATININE 1.11  CALCIUM 9.1   LFT Recent Labs    01/26/19 0231  PROT 7.2  ALBUMIN 4.0  AST 19  ALT 19  ALKPHOS 71  BILITOT 0.5   PT/INR Recent Labs    01/26/19 0315  LABPROT 13.2  INR 1.01     IMPRESSION:  #3 58 year old Latino male with 7 to 10-day history of epigastric discomfort, recurrent nausea and vomiting has been associated with coffee-ground appearing material. Patient has remote prior history of peptic  ulcer disease.  Current symptoms are concerning for recurrent peptic ulcer disease, erosive gastropathy, erosive esophagitis. Fortunately does not appear that he has had any significant blood loss normal hemoglobin on admission If ulcer disease found need to rule out H. pylori as other obvious risk factors  Plan; patient has been n.p.o. overnight. Will be scheduled for upper endoscopy with Dr. Myrtie Neither today Procedure was discussed in detail with the patient and his daughter who has interpreted for him.  We discussed indications risks benefits and he is agreeable to proceed. Start IV Protonix 40 mg twice daily IV Zofran every 6 hours as needed Serial hemoglobins  Further recommendations pending findings at EGD today.    Amy EsterwoodPA-C  01/26/2019, 8:44 AM  I have reviewed the entire case in detail with the above APP and discussed the plan in detail.  Therefore, I agree with the diagnoses recorded above. In addition,  I have personally interviewed and examined the patient and have personally reviewed any abdominal/pelvic CT scan images.  My additional thoughts are as follows:  Recent upper abdominal pain, remote history of ulcer without reports of that or documentation presently available.  Small-volume hematemesis, normal hemoglobin and hemodynamically stable.  Suspect peptic ulcer disease.  Upper endoscopy.  This was discussed with the patient, who does not speak [place], and his daughter, who does.  The benefits and risks of the  planned procedure were described in detail with the patient or (when appropriate) their health care proxy.  Risks were outlined as including, but not limited to, bleeding, infection, perforation, adverse medication reaction leading to cardiac or pulmonary decompensation, or pancreatitis (if ERCP).  The limitation of incomplete mucosal visualization was also discussed.  No guarantees or warranties were given.  Further plan to follow.  Charlie Pitter III Office:[phone]

## 2019-01-26 NOTE — ED Notes (Signed)
Pt actively vomiting and reports severe LUQ pain.

## 2019-01-26 NOTE — ED Triage Notes (Signed)
C/o sharp LUQ pain x 7 days with vomiting.  Pain worse tonight.  History of GERD and ulcers.

## 2019-01-26 NOTE — Discharge Summary (Signed)
Rodney Lester, is a 58 y.o. male  DOB [birth year]  MRN [id number].  Admission date:  01/26/2019  Admitting Physician  Lorretta Harp, MD  Discharge Date:  01/26/2019   Primary MD  Patient, No Pcp Per  Recommendations for primary care physician for things to follow:   1)Avoid ibuprofen/Advil/Aleve/Motrin/Goody Powders/Naproxen/BC powders/Meloxicam/Diclofenac/Indomethacin and other Nonsteroidal anti-inflammatory medications as these will make you more likely to bleed and can cause stomach ulcers, can also cause Kidney problems.   2)You Have gastroesophageal reflux disease--- avoid spicy food, avoid carbonated drinks, limit amount of tomato-based foods----please see #1 above   Admission Diagnosis  Abdominal pain;emesis   Discharge Diagnosis  Abdominal pain;emesis    Principal Problem:   GERD with Reflux esophagitis Active Problems:   Abdominal pain      Past Medical History:  Diagnosis Date  . GERD (gastroesophageal reflux disease)   . Multiple gastric ulcers     Past Surgical History:  Procedure Laterality Date  . VEIN SURGERY  2002       HPI  from the history and physical done on the day of admission:     Rodney Lester  is a 58 y.o. male  mostly Spanish-speaking male with history of dyspepsia, GERD and possible PUD presented to the ED with concerns about recurrent nausea and vomiting occasionally with black contents.... Symptoms have persisted for at least 10 days  Patient's daughter who speaks [place] is interpreting from [place] to [place] and vice versa  No hematochezia no melena... Post meal abdominal pain does not improve or get worse, patient reports some symptomatic relief with PPI use, No fevers or chills, no diarrhea, no sick contacts at home no dysphagia, no odynophagia  Denies excessive NSAID or alcohol use, no tobacco use  No chest pains no palpitations no dizziness no  shortness of breath no pleuritic symptoms no leg swelling or leg pain  In ED--- .   LFTs are not elevated, lipase is 58, no leukocytosis,Hemoglobin 15.5. CT scanning of abdomen/pelvis is without acute findings.    GI service was consulted, PPI advised, EGD planned     Hospital Course:    1)Recurrent emesis and epigastric/abdominal pain/GERD/reflux esophagitis-----no leukocytosis, no fevers, lipase only 58, patient denies alcohol use, LFTs not elevated----EGD from 01/26/2019  With LA Grade C reflux esophagitis AND- Medium-sized hiatal hernia--- otherwise without acute findings--- GI consult appreciated, recommends PPI twice daily for 4 weeks and then daily after that EGD biopsies pending--- see HPI section--- H&H remained stable    Discharge Condition: stable  Follow UP--- GI will call with biopsy results    Consults obtained - Gi   Diet and Activity recommendation:  As advised  Discharge Instructions   Discharge Instructions    Call MD for:  difficulty breathing, headache or visual disturbances   Complete by:  As directed    Call MD for:  persistant dizziness or light-headedness   Complete by:  As directed    Call MD for:  persistant nausea and vomiting   Complete by:  As directed    Call MD for:  severe uncontrolled pain   Complete by:  As directed    Call MD for:  temperature >100.4   Complete by:  As directed    Diet - low sodium heart healthy   Complete by:  As directed    You Have gastroesophageal reflux disease--- avoid spicy food, avoid carbonated drinks, limit amount of tomato-based foods---   Discharge instructions   Complete by:  As directed    1)Avoid ibuprofen/Advil/Aleve/Motrin/Goody Powders/Naproxen/BC powders/Meloxicam/Diclofenac/Indomethacin and other Nonsteroidal anti-inflammatory medications as these will make you more likely to bleed and can cause stomach ulcers, can also cause Kidney problems.   2)You Have gastroesophageal reflux disease--- avoid spicy  food, avoid carbonated drinks, limit amount of tomato-based foods----please see #1 above   Increase activity slowly   Complete by:  As directed         Discharge Medications     Allergies as of 01/26/2019   No Known Allergies     Medication List    TAKE these medications   acetaminophen 325 MG tablet Commonly known as:  TYLENOL Take 2 tablets (650 mg total) by mouth every 6 (six) hours as needed for mild pain (or Fever >/= 101).   calcium carbonate 500 MG chewable tablet Commonly known as:  TUMS Chew 1 tablet (200 mg of elemental calcium total) by mouth 2 (two) times daily as needed for indigestion or heartburn.   ondansetron 4 MG tablet Commonly known as:  ZOFRAN Take 1 tablet (4 mg total) by mouth every 6 (six) hours as needed for nausea.   pantoprazole 40 MG tablet Commonly known as:  PROTONIX Take 1 tablet (40 mg total) by mouth 2 (two) times daily before a meal. For 4 weeks, then once daily after that What changed:    when to take this  additional instructions       Major procedures and Radiology Reports - PLEASE review detailed and final reports for all details, in brief -   Ct Abdomen Pelvis W Contrast  Result Date: 01/26/2019 CLINICAL DATA:  Initial evaluation for acute sharp left upper quadrant pain, nausea, vomiting. EXAM: CT ABDOMEN AND PELVIS WITH CONTRAST TECHNIQUE: Multidetector CT imaging of the abdomen and pelvis was performed using the standard protocol following bolus administration of intravenous contrast. CONTRAST:  OMNIPAQUE IOHEXOL 300 MG/ML  SOLN COMPARISON:  Prior ultrasound from 12/26/2008. FINDINGS: Lower chest: Visualized lung bases are clear. Hepatobiliary: Liver demonstrates a normal contrast enhanced appearance. Gallbladder within normal limits. No biliary dilatation. Pancreas: Pancreas within normal limits. No acute peripancreatic inflammation to suggest pancreatitis. No pancreatic mass or abnormal pancreatic ductal dilatation. Spleen:  Unremarkable. Adrenals/Urinary Tract: Adrenal glands are normal. Kidneys equal in size with symmetric enhancement. No nephrolithiasis, hydronephrosis or focal enhancing renal mass. No hydroureter. Bladder within normal limits. Stomach/Bowel: Moderate sized hiatal hernia. Stomach otherwise decompressed without acute finding. No evidence for bowel obstruction. Normal appendix. No acute inflammatory changes seen about the bowels. Vascular/Lymphatic: Normal intravascular enhancement seen throughout the intra-abdominal aorta. Mesenteric vessels patent proximally. No adenopathy. Reproductive: Prostate normal. Other: No free air or fluid. Musculoskeletal: No acute osseous finding. No discrete lytic or blastic osseous lesions. IMPRESSION: No CT evidence for acute intra-abdominal or pelvic process. No findings to explain patient's symptoms identified. Electronically Signed   By: Rise Mu M.D.   On: 01/26/2019 04:57    Micro Results    No results found for this or any previous visit (from the past 240 hour(s)).  Today   Subjective    Rodney Lester today has no new complaints, abdominal pain and nausea is improved, no further emesis, no fevers. daughter at bedside, questions answered          Patient has been seen and examined prior to discharge   Objective   Blood pressure 99/63, pulse (!) 55, temperature 97.7 F (36.5 C), temperature source Oral, resp. rate 18, height 5\' 7"  (1.702 m), weight 104.3 kg, SpO2 98 %.   Intake/Output Summary (Last 24 hours) at 01/26/2019 1508 Last data filed at 01/26/2019 1023 Gross per 24 hour  Intake 103 ml  Output -  Net 103 ml    Exam Gen:- Awake Alert, no acute distress  HEENT:- [place].AT, No sclera icterus Neck-Supple Neck,No JVD,.  Lungs-  CTAB , good air movement bilaterally  CV- S1, S2 normal, regular Abd-  +ve B.Sounds, Abd Soft, mild epigastric discomfort without rebound or guarding Extremity/Skin:- No  edema,   good pulses Psych-affect  is appropriate, oriented x3 Neuro-no new focal deficits, no tremors    Data Review   CBC w Diff:  Lab Results  Component Value Date   WBC 7.4 01/26/2019   HGB 14.5 01/26/2019   HCT 44.0 01/26/2019   PLT 266 01/26/2019   LYMPHOPCT 12 11/25/2017   MONOPCT 10 11/25/2017   EOSPCT 0 11/25/2017   BASOPCT 0 11/25/2017    CMP:  Lab Results  Component Value Date   NA 139 01/26/2019   K 4.3 01/26/2019   CL 103 01/26/2019   CO2 22 01/26/2019   BUN 21 (H) 01/26/2019   CREATININE 1.11 01/26/2019   PROT 7.2 01/26/2019   ALBUMIN 4.0 01/26/2019   BILITOT 0.5 01/26/2019   ALKPHOS 71 01/26/2019   AST 19 01/26/2019   ALT 19 01/26/2019  .  Total Discharge time is about 33 minutes  Shon Haleourage Kimela Malstrom M.D on 01/26/2019 at 3:08 PM  Go to [email or web address] -  for contact info  [facility] - Office  [phone]

## 2019-01-26 NOTE — Op Note (Signed)
[facility] Patient Name: Rodney RotaDomingo Dickmann Procedure Date : 01/26/2019 MRN: [id number] Attending MD: Starr LakeHenry L. Myrtie Neitheranis , MD Date of Birth: 19-Nov-1961 CSN: [id number] Age: [id number] Admit Type: Inpatient Procedure:                Upper GI endoscopy Indications:              Epigastric abdominal pain, Hematemesis Providers:                Sherilyn CooterHenry L. Myrtie Neitheranis, MD, Margaree MackintoshHayleigh Westmoreland, RN,                            Kandice RobinsonsGuillaume Awaka, Technician Referring MD:             [facility] Medicines:                Monitored Anesthesia Care Complications:            No immediate complications. Estimated Blood Loss:     Estimated blood loss was minimal. Procedure:                Pre-Anesthesia Assessment:                           - Prior to the procedure, a History and Physical                            was performed, and patient medications and                            allergies were reviewed. The patient's tolerance of                            previous anesthesia was also reviewed. The risks                            and benefits of the procedure and the sedation                            options and risks were discussed with the patient.                            All questions were answered, and informed consent                            was obtained. Prior Anticoagulants: The patient has                            taken no previous anticoagulant or antiplatelet                            agents. ASA Grade Assessment: II - A patient with                            mild systemic disease. After reviewing the risks  and benefits, the patient was deemed in                            satisfactory condition to undergo the procedure.                           After obtaining informed consent, the endoscope was                            passed under direct vision. Throughout the                            procedure, the patient's blood pressure, pulse, and                         oxygen saturations were monitored continuously. The                            [id number]-H190 ([id number] Olympus gastroscope was                            introduced through the mouth, and advanced to the                            second part of duodenum. The upper GI endoscopy was                            accomplished without difficulty. The patient                            tolerated the procedure fairly well. Scope In: Scope Out: Findings:      LA Grade C (one or more mucosal breaks continuous between tops of 2 or       more mucosal folds, less than 75% circumference) esophagitis with no       bleeding was found at the gastroesophageal junction.      A medium-sized hiatal hernia was present.      The entire examined stomach was normal. Biopsies were taken from the       antrum with a cold forceps for histology (to rule out H pylori).      The cardia and gastric fundus were normal on retroflexion other than       hiatal hernia.      The examined duodenum was normal. Impression:               - LA Grade C reflux esophagitis.                           - Medium-sized hiatal hernia.                           - Normal stomach. Biopsied.                           - Normal examined duodenum. Recommendation:           - Return patient to hospital ward for discharge  same day.                           - Resume regular diet.                           - Use Prilosec (omeprazole) 40 mg PO BID for 4                            weeks, then decrease to 40 mg once daily.                           - Await pathology results. Procedure Code(s):        --- Professional ---                           [phone], Esophagogastroduodenoscopy, flexible,                            transoral; with biopsy, single or multiple Diagnosis Code(s):        --- Professional ---                           K21.0, Gastro-esophageal reflux disease with                             esophagitis                           K44.9, Diaphragmatic hernia without obstruction or                            gangrene                           R10.13, Epigastric pain                           K92.0, Hematemesis CPT copyright 2018 [employer]. All rights reserved. The codes documented in this report are preliminary and upon coder review may  be revised to meet current compliance requirements. Henry L. Myrtie Neither, MD 01/26/2019 10:31:10 AM This report has been signed electronically. Number of Addenda: 0

## 2019-01-26 NOTE — H&P (Signed)
Patient Demographics:    Rodney Lester, is a 58 y.o. male  MRN: [id number]   DOB - 03-Mar-1961  Admit Date - 01/26/2019  Outpatient Primary MD for the patient is Patient, No Pcp Per   Assessment & Plan:    Active Problems:   Abdominal pain     1) recurrent emesis and epigastric/abdominal pain-----no leukocytosis, no fevers, lipase only 58, patient denies alcohol use, LFTs not elevated----EGD planned for 01/26/2019  --- GI consult appreciated, recommends PPI    With History of - Reviewed by me  Past Medical History:  Diagnosis Date  . GERD (gastroesophageal reflux disease)   . Multiple gastric ulcers       Past Surgical History:  Procedure Laterality Date  . VEIN SURGERY  2002      Chief Complaint  Patient presents with  . Abdominal Pain  . Emesis      HPI:    Rodney Lester  is a 58 y.o. male   mostly Spanish-speaking male with history of dyspepsia, GERD and possible PUD presented to the ED with concerns about recurrent nausea and vomiting occasionally with black contents.... Symptoms have persisted for at least 10 days  Patient's daughter who speaks [place] is interpreting from [place] to [place] and vice versa  No hematochezia no melena... Post meal abdominal pain does not improve or get worse, patient reports some symptomatic relief with PPI use, No fevers or chills, no diarrhea, no sick contacts at home no dysphagia, no odynophagia  Denies excessive NSAID or alcohol use, no tobacco use  No chest pains no palpitations no dizziness no shortness of breath no pleuritic symptoms no leg swelling or leg pain  In ED--- .   LFTs are not elevated, lipase is 58, no leukocytosis, Hemoglobin 15.5.  CT scanning of abdomen/pelvis is without acute findings.     GI service was consulted, PPI advised, EGD  planned   Review of systems:    In addition to the HPI above,   A full Review of  Systems was done, all other systems reviewed are negative except as noted above in HPI , .    Social History:  Reviewed by me    Social History   Tobacco Use  . Smoking status: Never Smoker  . Smokeless tobacco: Never Used  Substance Use Topics  . Alcohol use: No    Frequency: Never       Family History :  Reviewed by me  HTN   Home Medications:   Prior to Admission medications   Medication Sig Start Date End Date Taking? Authorizing Provider  acetaminophen (TYLENOL) 325 MG tablet Take 2 tablets (650 mg total) by mouth every 6 (six) hours as needed for mild pain (or Fever >/= 101). 01/26/19   Shon Hale, MD  calcium carbonate (TUMS) 500 MG chewable tablet Chew 1 tablet (200 mg of elemental calcium total) by mouth 2 (two) times daily as needed for indigestion or heartburn.  01/26/19 01/26/20  Shon Hale, MD  ondansetron (ZOFRAN) 4 MG tablet Take 1 tablet (4 mg total) by mouth every 6 (six) hours as needed for nausea. 01/26/19   Shon Hale, MD  pantoprazole (PROTONIX) 40 MG tablet Take 1 tablet (40 mg total) by mouth 2 (two) times daily before a meal. For 4 weeks, then once daily after that 01/26/19   Shon Hale, MD     Allergies:    No Known Allergies   Physical Exam:   Vitals  Blood pressure 99/63, pulse (!) 55, temperature 97.7 F (36.5 C), temperature source Oral, resp. rate 18, height 5\' 7"  (1.702 m), weight 104.3 kg, SpO2 98 %.  Physical Examination: General appearance - alert, well appearing, and in no distress and  Mental status - alert, oriented to person, place, and time,  Eyes - sclera anicteric Neck - supple, no JVD elevation , Chest - clear  to auscultation bilaterally, symmetrical air movement,  Heart - S1 and S2 normal, regular  Abdomen - soft, nondistended, no masses or organomegaly, epigastric discomfort without rebound or guarding, no CVA area  tenderness Neurological - screening mental status exam normal, neck supple without rigidity, cranial nerves II through XII intact, DTR's normal and symmetric Extremities - no pedal edema noted, intact peripheral pulses  Skin - warm, dry     Data Review:    CBC Recent Labs  Lab 01/26/19 0231 01/26/19 1117  WBC 7.4  --   HGB 15.5 14.5  HCT 46.5 44.0  PLT 266  --   MCV 91.5  --   MCH 30.5  --   MCHC 33.3  --   RDW 12.7  --    ------------------------------------------------------------------------------------------------------------------  Chemistries  Recent Labs  Lab 01/26/19 0231  NA 139  K 4.3  CL 103  CO2 22  GLUCOSE 117*  BUN 21*  CREATININE 1.11  CALCIUM 9.1  AST 19  ALT 19  ALKPHOS 71  BILITOT 0.5   ------------------------------------------------------------------------------------------------------------------ estimated creatinine clearance is 84.5 mL/min (by C-G formula based on SCr of 1.11 mg/dL). ------------------------------------------------------------------------------------------------------------------ No results for input(s): TSH, T4TOTAL, T3FREE, THYROIDAB in the last 72 hours.  Invalid input(s): FREET3   Coagulation profile Recent Labs  Lab 01/26/19 0315  INR 1.01   ------------------------------------------------------------------------------------------------------------------- No results for input(s): DDIMER in the last 72 hours. -------------------------------------------------------------------------------------------------------------------  Cardiac Enzymes No results for input(s): CKMB, TROPONINI, MYOGLOBIN in the last 168 hours.  Invalid input(s): CK ------------------------------------------------------------------------------------------------------------------ No results found for: BNP   ---------------------------------------------------------------------------------------------------------------  Urinalysis      Component Value Date/Time   COLORURINE STRAW (A) 01/26/2019 0838   APPEARANCEUR CLEAR 01/26/2019 0838   LABSPEC 1.029 01/26/2019 0838   PHURINE 7.0 01/26/2019 0838   GLUCOSEU NEGATIVE 01/26/2019 0838   HGBUR NEGATIVE 01/26/2019 0838   BILIRUBINUR NEGATIVE 01/26/2019 0838   KETONESUR NEGATIVE 01/26/2019 0838   PROTEINUR NEGATIVE 01/26/2019 0838   NITRITE NEGATIVE 01/26/2019 0838   LEUKOCYTESUR NEGATIVE 01/26/2019 0838    ----------------------------------------------------------------------------------------------------------------   Imaging Results:    Ct Abdomen Pelvis W Contrast  Result Date: 01/26/2019 CLINICAL DATA:  Initial evaluation for acute sharp left upper quadrant pain, nausea, vomiting. EXAM: CT ABDOMEN AND PELVIS WITH CONTRAST TECHNIQUE: Multidetector CT imaging of the abdomen and pelvis was performed using the standard protocol following bolus administration of intravenous contrast. CONTRAST:  OMNIPAQUE IOHEXOL 300 MG/ML  SOLN COMPARISON:  Prior ultrasound from 12/26/2008. FINDINGS: Lower chest: Visualized lung bases are clear. Hepatobiliary: Liver demonstrates a normal contrast enhanced appearance. Gallbladder within normal  limits. No biliary dilatation. Pancreas: Pancreas within normal limits. No acute peripancreatic inflammation to suggest pancreatitis. No pancreatic mass or abnormal pancreatic ductal dilatation. Spleen: Unremarkable. Adrenals/Urinary Tract: Adrenal glands are normal. Kidneys equal in size with symmetric enhancement. No nephrolithiasis, hydronephrosis or focal enhancing renal mass. No hydroureter. Bladder within normal limits. Stomach/Bowel: Moderate sized hiatal hernia. Stomach otherwise decompressed without acute finding. No evidence for bowel obstruction. Normal appendix. No acute inflammatory changes seen about the bowels. Vascular/Lymphatic: Normal intravascular enhancement seen throughout the intra-abdominal aorta. Mesenteric vessels patent  proximally. No adenopathy. Reproductive: Prostate normal. Other: No free air or fluid. Musculoskeletal: No acute osseous finding. No discrete lytic or blastic osseous lesions. IMPRESSION: No CT evidence for acute intra-abdominal or pelvic process. No findings to explain patient's symptoms identified. Electronically Signed   By: Rise MuBenjamin  McClintock M.D.   On: 01/26/2019 04:57    Radiological Exams on Admission: Ct Abdomen Pelvis W Contrast  Result Date: 01/26/2019 CLINICAL DATA:  Initial evaluation for acute sharp left upper quadrant pain, nausea, vomiting. EXAM: CT ABDOMEN AND PELVIS WITH CONTRAST TECHNIQUE: Multidetector CT imaging of the abdomen and pelvis was performed using the standard protocol following bolus administration of intravenous contrast. CONTRAST:  100mL OMNIPAQUE IOHEXOL 300 MG/ML  SOLN COMPARISON:  Prior ultrasound from 12/26/2008. FINDINGS: Lower chest: Visualized lung bases are clear. Hepatobiliary: Liver demonstrates a normal contrast enhanced appearance. Gallbladder within normal limits. No biliary dilatation. Pancreas: Pancreas within normal limits. No acute peripancreatic inflammation to suggest pancreatitis. No pancreatic mass or abnormal pancreatic ductal dilatation. Spleen: Unremarkable. Adrenals/Urinary Tract: Adrenal glands are normal. Kidneys equal in size with symmetric enhancement. No nephrolithiasis, hydronephrosis or focal enhancing renal mass. No hydroureter. Bladder within normal limits. Stomach/Bowel: Moderate sized hiatal hernia. Stomach otherwise decompressed without acute finding. No evidence for bowel obstruction. Normal appendix. No acute inflammatory changes seen about the bowels. Vascular/Lymphatic: Normal intravascular enhancement seen throughout the intra-abdominal aorta. Mesenteric vessels patent proximally. No adenopathy. Reproductive: Prostate normal. Other: No free air or fluid. Musculoskeletal: No acute osseous finding. No discrete lytic or blastic osseous  lesions. IMPRESSION: No CT evidence for acute intra-abdominal or pelvic process. No findings to explain patient's symptoms identified. Electronically Signed   By: Rise MuBenjamin  McClintock M.D.   On: 01/26/2019 04:57   DVT Prophylaxis -SCD  AM Labs Ordered, also please review Full Orders  Family Communication: Admission, patients condition and plan of care including tests being ordered have been discussed with the patient and daughter at bedside who indicate understanding and agree with the plan   Code Status - Full Code   Likely DC to  Home   Condition   stable  Shon Haleourage Brigida Scotti M.D on 01/26/2019 at 2:08 PM Go to [email or web address] -  for contact info  [facility] - Office  [phone]

## 2019-01-26 NOTE — Anesthesia Postprocedure Evaluation (Signed)
Anesthesia Post Note  Patient: [facility]  Procedure(s) Performed: ESOPHAGOGASTRODUODENOSCOPY (EGD) WITH PROPOFOL (N/A ) BIOPSY     Patient location during evaluation: PACU Anesthesia Type: MAC Level of consciousness: awake and alert Pain management: pain level controlled Vital Signs Assessment: post-procedure vital signs reviewed and stable Respiratory status: spontaneous breathing, nonlabored ventilation, respiratory function stable and patient connected to nasal cannula oxygen Cardiovascular status: stable and blood pressure returned to baseline Postop Assessment: no apparent nausea or vomiting Anesthetic complications: no    Last Vitals:  Vitals:   01/26/19 1033 01/26/19 1041  BP: (!) 121/58 108/72  Pulse: 86 63  Resp: (!) 22 16  Temp: 36.5 C   SpO2: 98% 97%    Last Pain:  Vitals:   01/26/19 1122  TempSrc:   PainSc: 5                  [place] Deleo DAVID

## 2019-01-26 NOTE — Care Management (Signed)
  This is a no charge note  Pending admission per Dr. Penne Lash  58 year old male with a past medical history for gastric ulcer, who presents with left upper quadrant abdominal pain for 7 days, associated with nausea vomiting.  Lipase normal.  Liver function normal.  Hemoglobin 15.5.  CT scanning of abdomen/pelvis is negative.  Suspect patient may have worsening gastric ulcer disease.  ED physician consulted [facility], Dr. Myrtie Neither who recommended to start IV Protonix. They probably will scope patient today.  Keep patient n.p.o.  Patient is placed on MedSurg [facility] for observation     Lorretta Harp, MD  [facility]   If 7PM-7AM, please contact night-coverage [email or web address] Password [email or web address] 01/26/2019, 6:20 AM

## 2019-01-26 NOTE — ED Provider Notes (Signed)
[facility] EMERGENCY DEPARTMENT Provider Note   CSN: [id number] Arrival date & time: 01/26/19  0154     History   Chief Complaint Chief Complaint  Patient presents with  . Abdominal Pain  . Emesis    HPI Rodney Lester is a 58 y.o. male.  HPI   59 year old male with history of multiple gastric ulcers here with abdominal pain.  The patient states approximately a week ago, he had mild abdominal pain followed by emesis with coffee-ground emesis.  Since then, he has been trying to eat a diet that is healthy for his ulcers, but starting yesterday evening, he began to develop progressively worsening aching, gnawing, severe, epigastric pain with nausea and vomiting.  He has now began to have bright red blood in his emesis.  He has associated aching, throbbing, severe pain in his epigastric area, radiating to his back.  He is been able to eat or drink.  Denies any melena or blood in his stools.  He has a history of similar episodes and last had an EGD at least 7 or 8 years ago.  Denies any heavy NSAID or alcohol use.  No other complaints.  No fevers or chills.  Past Medical History:  Diagnosis Date  . GERD (gastroesophageal reflux disease)   . Multiple gastric ulcers     Patient Active Problem List   Diagnosis Date Noted  . Abdominal pain 01/26/2019    Past Surgical History:  Procedure Laterality Date  . VEIN SURGERY  2002        Home Medications    Prior to Admission medications   Medication Sig Start Date End Date Taking? Authorizing Provider  pantoprazole (PROTONIX) 40 MG tablet Take 40 mg by mouth daily.   Yes [provider]    Family History No family history on file.  Social History Social History   Tobacco Use  . Smoking status: Never Smoker  . Smokeless tobacco: Never Used  Substance Use Topics  . Alcohol use: No    Frequency: Never  . Drug use: No     Allergies   Patient has no known allergies.   Review of  Systems Review of Systems  Constitutional: Positive for fatigue. Negative for chills and fever.  HENT: Negative for congestion and rhinorrhea.   Eyes: Negative for visual disturbance.  Respiratory: Negative for cough, shortness of breath and wheezing.   Cardiovascular: Negative for chest pain and leg swelling.  Gastrointestinal: Positive for abdominal pain, nausea and vomiting. Negative for diarrhea.  Genitourinary: Negative for dysuria and flank pain.  Musculoskeletal: Negative for neck pain and neck stiffness.  Skin: Negative for rash and wound.  Allergic/Immunologic: Negative for immunocompromised state.  Neurological: Negative for syncope, weakness and headaches.  All other systems reviewed and are negative.    Physical Exam Updated Vital Signs BP 115/81   Pulse 60   Temp 98.2 F (36.8 C) (Oral)   Resp 18   SpO2 97%   Physical Exam Vitals signs and nursing note reviewed.  Constitutional:      General: He is in acute distress.     Appearance: He is well-developed.  HENT:     Head: Normocephalic and atraumatic.  Eyes:     Conjunctiva/sclera: Conjunctivae normal.  Neck:     Musculoskeletal: Neck supple.  Cardiovascular:     Rate and Rhythm: Normal rate and regular rhythm.     Heart sounds: Normal heart sounds. No murmur. No friction rub.  Pulmonary:  Effort: Pulmonary effort is normal. No respiratory distress.     Breath sounds: Normal breath sounds. No wheezing or rales.  Abdominal:     General: Bowel sounds are normal. There is no distension.     Palpations: Abdomen is soft.     Tenderness: There is generalized abdominal tenderness and tenderness in the epigastric area. There is guarding. There is no right CVA tenderness, left CVA tenderness or rebound.  Skin:    General: Skin is warm.     Capillary Refill: Capillary refill takes less than 2 seconds.  Neurological:     Mental Status: He is alert and oriented to person, place, and time.     Motor: No abnormal  muscle tone.      ED Treatments / Results  Labs (all labs ordered are listed, but only abnormal results are displayed) Labs Reviewed  LIPASE, BLOOD - Abnormal; Notable for the following components:      Result Value   Lipase 58 (*)    All other components within normal limits  COMPREHENSIVE METABOLIC PANEL - Abnormal; Notable for the following components:   Glucose, Bld 117 (*)    BUN 21 (*)    All other components within normal limits  CBC  PROTIME-INR  URINALYSIS, ROUTINE W REFLEX MICROSCOPIC    EKG None  Radiology Ct Abdomen Pelvis W Contrast  Result Date: 01/26/2019 CLINICAL DATA:  Initial evaluation for acute sharp left upper quadrant pain, nausea, vomiting. EXAM: CT ABDOMEN AND PELVIS WITH CONTRAST TECHNIQUE: Multidetector CT imaging of 5AntonyUs Air F58AntonySt Josep4AntonyLakeland Surgical And Diagnostic Center LLP Gri(3AntonyMemorialcare Surgical CAntonyValley 7AntonyKenmare Community 6AntonySaint ALPhonsus(8AntonyCarilion Giles Memorial HospitalClifton Springs HospitaR475KentucGIllino4AntonySouth Hills Surgery Center LLCEx(9AntonySelect Specialty Hospita7AntonyHolzer Medical Cent3AntonyMerit H7AntonyOverlook Hospita(2AntonyTrinity Surgery 7AntonySt Joseph'S HospitAntonyChampion Medical Center - Baton R3AntonyHaskell Memorial HospitalFranciscan St Elizabet7AntonyRegency Hospital Of Cleveland WestCorpus Christi Surgicare Ltd Dba Corpus Christi Outpatient Surgery CenteR475KentucGIllinoisEncompass Health RehabilitaMolly MaduTrudie Reedtal Of Tallahassee5KentucGIllinoisWalter Reed NatiMolly M uKoreaT hest: Visualized lung bases are clear. Hepatobiliary: Liver demonstrates a normal contrast enhanced appearance. Gallbladder within normal limits. No biliary dilatation. Pancreas: Pancreas within normal limits. No acute peripancreatic inflammation to suggest pancreatitis. No pancreatic mass or abnormal pancreatic ductal dilatation. Spleen: Unremarkable. Adrenals/Urinary Tract: Adrenal glands are normal. Kidneys equal in size with symmetric enhancement. No nephrolithiasis, hydronephrosis or focal enhancing renal mass. No hydroureter. Bladder within normal limits. Stomach/Bowel: Moderate sized hiatal hernia. Stomach otherwise decompressed without acute finding. No evidence for bowel obstruction. Normal appendix. No acute inflammatory changes seen about the bowels. Vascular/Lymphatic: Normal intravascular enhancement seen throughout the intra-abdominal aorta. Mesenteric vessels patent proximally. No adenopathy.  Reproductive: Prostate normal. Other: No free air or fluid. Musculoskeletal: No acute osseous finding. No discrete lytic or blastic osseous lesions. IMPRESSION: No CT evidence for acute intra-abdominal or pelvic process. No findings to explain patient's symptoms identified. Electronically Signed   By: Benjamin  McClintock M.D.   On: 01/26/2019 04:57    Procedures Procedures (including critical care time)  Medications Ordered in ED Medications  metoCLOPramide (REGLAN) injection 10 mg (has no administration in time range)  morphine 4 MG/ML injection 4 mg (has no administration in time range)  diphenhydrAMINE (BENADRYL) injection 25 mg (has no administration in time range)  ondansetron (ZOFRAN) injection 4 mg (has no administration in time range)  morphine 2 MG/ML injection 2 mg (has no administration in time range)  0.9 %  sodium chloride infusion (has no administration in time range)  sodium chloride flush (NS) 0.9 % injection 3 mL (3 mLs Intravenous Given 01/26/19 0415)  morphine 4 MG/ML injection 6 mg (6 mg Intravenous Given 01/26/19 0408)  ondansetron (ZOFRAN) injection 4 mg (4 mg Intravenous Given 01/26/19 0405)  pantoprazole (PROTONIX) injection 40 mg (40 mg Intravenous  Given 01/26/19 0411)  alum & mag hydroxide-simeth (MAALOX/MYLANTA) 200-200-20 MG/5ML suspension 30 mL (30 mLs Oral Given 01/26/19 0441)    And  lidocaine (XYLOCAINE) 2 % viscous mouth solution 15 mL (15 mLs Oral Given 01/26/19 0441)  iohexol (OMNIPAQUE) 300 MG/ML solution 100 mL (100 mLs Intravenous Contrast Given 01/26/19 0420)     Initial Impression / Assessment and Plan / ED Course  I have reviewed the triage vital signs and the nursing notes.  Pertinent labs & imaging results that were available during my care of the patient were reviewed by me and considered in my medical decision making (see chart for details).     58 yo m here with severe abdominal pain. Suspect recurrent gastric ulcer. He is HDS here with stable Hgb. IV  PPI started. CT scan shows no perforation. Discussed with Dr. Myrtie Neither, will keep NPO, admit ot medicine. Pain improved in ED.  Final Clinical Impressions(s) / ED Diagnoses   Final diagnoses:  Acute gastric ulcer with hemorrhage    ED Discharge Orders    None       Shaune Pollack, MD 01/26/19 [phone]

## 2019-01-26 NOTE — Interval H&P Note (Signed)
History and Physical Interval Note:  01/26/2019 10:03 AM  [name]  has presented today for surgery, with the diagnosis of GI bleed  The various methods of treatment have been discussed with the patient and family. After consideration of risks, benefits and other options for treatment, the patient has consented to  Procedure(s): ESOPHAGOGASTRODUODENOSCOPY (EGD) WITH PROPOFOL (N/A) as a surgical intervention .  The patient's history has been reviewed, patient examined, no change in status, stable for surgery.  I have reviewed the patient's chart and labs.  Questions were answered to the patient's satisfaction.     Rodney Lester

## 2019-01-26 NOTE — H&P (View-Only) (Signed)
Consultation  Referring Provider: [facility] Mariea Clonts/Emokpae Primary Care Physician:  Patient, No Pcp Per Primary Gastroenterologist:  none  Reason for Consultation: Nausea vomiting abdominal pain and coffee-ground emesis   HPI: Rodney Lester is a 58 y.o. Hispanic male, who speaks very little [place].  Patient was admitted through the emergency room last evening after presenting with several day history of epigastric pain which he describes as twisting in nature.  This is been associated with intermittent nausea and vomiting especially with any significant p.o. intake.  He says about 10 days ago he vomited up a large amount of material that was black.  Since then most episodes of emesis have contained smaller amounts of black material.  He denies any dysphagia or done aphasia burn or indigestion.  He has not noted any melena or hematochezia. He denies any alcohol or aspirin or NSAID use.  He believes that about 10 years ago he was admitted with similar symptoms, had an endoscopy and was found to have ulcers.  He does not remember the gastroenterologist and has not seen anybody GI as an outpatient. He did get started on OTC Protonix sometime within the past week.  Patient is otherwise generally healthy.  No known significant chronic medical problems.  Evaluation in the ER last night with CT scan of the abdomen and pelvis showing a moderate sized hiatal hernia and otherwise negative study Labs pertinent for hemoglobin of 15, and BUN 21 LFTs normal.  He has been hemodynamically stable   Past Medical History:  Diagnosis Date  . GERD (gastroesophageal reflux disease)   . Multiple gastric ulcers     Past Surgical History:  Procedure Laterality Date  . VEIN SURGERY  2002    Prior to Admission medications   Medication Sig Start Date End Date Taking? Authorizing Provider  pantoprazole (PROTONIX) 40 MG tablet Take 40 mg by mouth daily.   Yes [provider]    Current  Facility-Administered Medications  Medication Dose Route Frequency Provider Last Rate Last Dose  . dextrose 5 %-0.45 % sodium chloride infusion   Intravenous Continuous Shon HaleEmokpae, Courage, MD 125 mL/hr at 01/26/19 0756    . diphenhydrAMINE (BENADRYL) injection 25 mg  25 mg Intravenous Once Shaune PollackIsaacs, Cameron, MD      . metoCLOPramide (REGLAN) injection 10 mg  10 mg Intravenous Once Shaune PollackIsaacs, Cameron, MD      . morphine 2 MG/ML injection 2 mg  2 mg Intravenous Q4H PRN Lorretta HarpNiu, Xilin, MD      . morphine 4 MG/ML injection 4 mg  4 mg Intravenous Once Shaune PollackIsaacs, Cameron, MD      . ondansetron [facility](ZOFRAN) injection 4 mg  4 mg Intravenous Q8H PRN Lorretta HarpNiu, Xilin, MD      . pantoprazole (PROTONIX) injection 40 mg  40 mg Intravenous Daily Emokpae, Courage, MD   40 mg at 01/26/19 0800    Allergies as of 01/26/2019  . (No Known Allergies)    No family history on file.  Social History   Socioeconomic History  . Marital status: Married    Spouse name: Not on file  . Number of children: Not on file  . Years of education: Not on file  . Highest education level: Not on file  Occupational History  . Not on file  Social Needs  . Financial resource strain: Not on file  . Food insecurity:    Worry: Not on file    Inability: Not on file  . Transportation needs:  Medical: Not on file    Non-medical: Not on file  Tobacco Use  . Smoking status: Never Smoker  . Smokeless tobacco: Never Used  Substance and Sexual Activity  . Alcohol use: No    Frequency: Never  . Drug use: No  . Sexual activity: Not on file  Lifestyle  . Physical activity:    Days per week: Not on file    Minutes per session: Not on file  . Stress: Not on file  Relationships  . Social connections:    Talks on phone: Not on file    Gets together: Not on file    Attends religious service: Not on file    Active member of club or organization: Not on file    Attends meetings of clubs or organizations: Not on file    Relationship status: Not on  file  . Intimate partner violence:    Fear of current or ex partner: Not on file    Emotionally abused: Not on file    Physically abused: Not on file    Forced sexual activity: Not on file  Other Topics Concern  . Not on file  Social History Narrative  . Not on file    Review of Systems: Pertinent positive and negative review of systems were noted in the above HPI section.  All other review of systems was otherwise negative.  Physical Exam: Vital signs in last 24 hours: Temp:  [97.8 F (36.6 C)-98.2 F (36.8 C)] 97.8 F (36.6 C) (02/08 0750) Pulse Rate:  [52-63] 61 (02/08 0750) Resp:  [16-18] 17 (02/08 0750) BP: (107-129)/(76-97) 120/83 (02/08 0750) SpO2:  [95 %-100 %] 96 % (02/08 0750)   General:   Alert,  Well-developed, well-nourished, Hispanic male pleasant and cooperative in NAD.  Daughter at bedside Head:  Normocephalic and atraumatic. Eyes:  Sclera clear, no icterus.   Conjunctiva pink. Ears:  Normal auditory acuity. Nose:  No deformity, discharge,  or lesions. Mouth:  No deformity or lesions.   Neck:  Supple; no masses or thyromegaly. Lungs:  Clear throughout to auscultation.   No wheezes, crackles, or rhonchi. Heart:  Regular rate and rhythm; no murmurs, clicks, rubs,  or gallops. Abdomen:  Soft, mildly tender epigastrium, BS active,nonpalp mass or hsm.   Rectal:  Deferred  Msk:  Symmetrical without gross deformities. . Pulses:  Normal pulses noted. Extremities:  Without clubbing or edema. Neurologic:  Alert and  oriented x4;  grossly normal neurologically. Skin:  Intact without significant lesions or rashes.. Psych:  Alert and cooperative. Normal mood and affect.  Intake/Output from previous day: 02/07 0701 - 02/08 0700 In: 3 [I.V.:3] Out: -  Intake/Output this shift: No intake/output data recorded.  Lab Results: Recent Labs    01/26/19 0231  WBC 7.4  HGB 15.5  HCT 46.5  PLT 266   BMET Recent Labs    01/26/19 0231  NA 139  K 4.3  CL 103  CO2  22  GLUCOSE 117*  BUN 21*  CREATININE 1.11  CALCIUM 9.1   LFT Recent Labs    01/26/19 0231  PROT 7.2  ALBUMIN 4.0  AST 19  ALT 19  ALKPHOS 71  BILITOT 0.5   PT/INR Recent Labs    01/26/19 0315  LABPROT 13.2  INR 1.01     IMPRESSION:  #40 58 year old Latino male with 7 to 10-day history of epigastric discomfort, recurrent nausea and vomiting has been associated with coffee-ground appearing material. Patient has remote prior history of peptic  ulcer disease.  Current symptoms are concerning for recurrent peptic ulcer disease, erosive gastropathy, erosive esophagitis. Fortunately does not appear that he has had any significant blood loss normal hemoglobin on admission If ulcer disease found need to rule out H. pylori as other obvious risk factors  Plan; patient has been n.p.o. overnight. Will be scheduled for upper endoscopy with Dr. Myrtie Neitheranis today Procedure was discussed in detail with the patient and his daughter who has interpreted for him.  We discussed indications risks benefits and he is agreeable to proceed. Start IV Protonix 40 mg twice daily IV Zofran every 6 hours as needed Serial hemoglobins  Further recommendations pending findings at EGD today.    Amy EsterwoodPA-C  01/26/2019, 8:44 AM  I have reviewed the entire case in detail with the above APP and discussed the plan in detail.  Therefore, I agree with the diagnoses recorded above. In addition,  I have personally interviewed and examined the patient and have personally reviewed any abdominal/pelvic CT scan images.  My additional thoughts are as follows:  Recent upper abdominal pain, remote history of ulcer without reports of that or documentation presently available.  Small-volume hematemesis, normal hemoglobin and hemodynamically stable.  Suspect peptic ulcer disease.  Upper endoscopy.  This was discussed with the patient, who does not speak [place]English, and his wife, who does.  The benefits and risks of the  planned procedure were described in detail with the patient or (when appropriate) their health care proxy.  Risks were outlined as including, but not limited to, bleeding, infection, perforation, adverse medication reaction leading to cardiac or pulmonary decompensation, or pancreatitis (if ERCP).  The limitation of incomplete mucosal visualization was also discussed.  No guarantees or warranties were given.  Further plan to follow.  Charlie PitterHenry L Danis III [phone]

## 2019-01-26 NOTE — Discharge Instructions (Signed)
1)Avoid ibuprofen/Advil/Aleve/Motrin/Goody Powders/Naproxen/BC powders/Meloxicam/Diclofenac/Indomethacin and other Nonsteroidal anti-inflammatory medications as these will make you more likely to bleed and can cause stomach ulcers, can also cause Kidney problems.   2)You Have gastroesophageal reflux disease--- avoid spicy food, avoid carbonated drinks, limit amount of tomato-based foods----please see #1 above  Gammagrafa de reflujo gastroesofgico Gastroesophageal Reflux Scan La gammagrafa de reflujo gastroesofgico es un procedimiento que verifica el flujo retrgrado de los contenidos del estmago hacia el esfago (reflujo gastroesofgico). El esfago es el conducto que transporta los alimentos desde la boca hasta el Hazel Crestestmago. La gammagrafa tambin puede mostrar si algn contenido del estmago se ha inhalado (aspirado) en los pulmones. Puede necesitar esta gammagrafa si tiene sntomas tales como acidez estomacal, vmitos, dificultad para tragar o regurgitacin. La regurgitacin es cuando los alimentos ingeridos regresan desde el estmago hacia el esfago. Para esta exploracin, beber un lquido que contiene una pequea cantidad de una sustancia radiactiva Radiographer, therapeutic(marcador). Un escner con Neomia Dearuna cmara que detecta el marcador mostrar si parte del material fluye de vuelta hacia el esfago. Informe al mdico acerca de lo siguiente:  Cualquier alergia que tenga.  Todos los Walt Disneymedicamentos que utiliza, incluidos vitaminas, hierbas, gotas oftlmicas, cremas y [address]medicamentos de [address] Northventa libre.  Cualquier enfermedad de la sangre que tenga.  Cirugas a las que se someti.  Cualquier afeccin mdica que tenga.  Si est embarazada o podra estarlo.  Si est amamantando. Cules son los riesgos? En general, se trata de un procedimiento seguro. Sin embargo, pueden ocurrir complicaciones, por ejemplo:  Exposicin a la radiacin (pequea cantidad).  Reaccin alrgica a la sustancia radiactiva. Esto es raro, pero  puede incluir hinchazn de la lengua y dificultad para Industrial/product designerrespirar. Qu ocurre antes del procedimiento?  Consulte a su mdico si debe cambiar o suspender los medicamentos que toma habitualmente.  Siga las indicaciones del mdico respecto de las restricciones para las comidas o las bebidas. Qu ocurre durante el procedimiento?   Beber un lquido que contiene una pequea cantidad de un Diplomatic Services operational officermarcador radioactivo. Este lquido ser probablemente similar a un jugo de [place]ownnaranja.  Usted se recostar Angolaboca arriba.  Se obtendr una serie de imgenes del esfago y la parte superior del Central Squareestmago.  Se le podr pedir que se mueva en diferentes posiciones para ayudar a determinar si el reflujo ocurre con ms frecuencia cuando est en posiciones especficas.  Para los adultos, se puede colocar una faja abdominal con un manguito inflable en el abdomen. Esto puede utilizarse para aumentar la presin abdominal. Se tomarn ms imgenes para determinar si el aumento de la presin causa el reflujo. Este procedimiento puede variar segn el mdico y el hospital. Qu puedo esperar despus del procedimiento?   Reanude sus actividades normales y su dieta como se lo haya indicado el mdico.  El marcador radioactivo se eliminar del cuerpo luego de McAdooalgunos das. Beba suficiente lquido como para Pharmacologistmantener la orina de color amarillo plido. Esto ayudar a Event organisereliminar el marcador del cuerpo.  Es su responsabilidad retirar Starbucks Corporationlos resultados del procedimiento. Pregntele al mdico o a alguien del departamento donde se realice el procedimiento cundo y cmo Amgen Incobtendr los resultados. Resumen  La gammagrafa de reflujo gastroesofgico es un procedimiento que verifica el flujo retrgrado de los contenidos del estmago hacia el esfago (reflujo gastroesofgico).  Puede necesitar esta gammagrafa si tiene sntomas tales como acidez estomacal, vmitos, dificultad para tragar o regurgitacin.  Antes del procedimiento, siga las  indicaciones del mdico respecto de las restricciones de comidas o bebidas, y si debe tomar sus medicamentos  habituales.  Para este procedimiento, beber un lquido que contiene una pequea cantidad de una sustancia radiactiva Radiographer, therapeutic). Un escner con Neomia Dear cmara que detecta el marcador mostrar si parte del material fluye de vuelta hacia el esfago. Esta informacin no tiene Theme park manager el consejo del mdico. Asegrese de hacerle al mdico cualquier pregunta que tenga. Document Released: 09/25/2013 Document Revised: 01/03/2018 Document Reviewed: 01/03/2018 Elsevier Interactive Patient Education  2019 Elsevier Inc.   1)Avoid ibuprofen/Advil/Aleve/Motrin/Goody Powders/Naproxen/BC powders/Meloxicam/Diclofenac/Indomethacin and other Nonsteroidal anti-inflammatory medications as these will make you more likely to bleed and can cause stomach ulcers, can also cause Kidney problems.   2)You Have gastroesophageal reflux disease--- avoid spicy food, avoid carbonated drinks, limit amount of tomato-based foods----please see #1 above

## 2019-01-26 NOTE — Anesthesia Preprocedure Evaluation (Signed)
Anesthesia Evaluation  Patient identified by MRN, date of birth, ID band Patient awake    Reviewed: Allergy & Precautions, NPO status , Patient's Chart, lab work & pertinent test results  Airway Mallampati: I  TM Distance: >3 FB Neck ROM: Full    Dental   Pulmonary    Pulmonary exam normal        Cardiovascular Normal cardiovascular exam     Neuro/Psych    GI/Hepatic GERD  Medicated and Controlled,  Endo/Other    Renal/GU      Musculoskeletal   Abdominal   Peds  Hematology   Anesthesia Other Findings   Reproductive/Obstetrics                             Anesthesia Physical Anesthesia Plan  ASA: II  Anesthesia Plan: MAC   Post-op Pain Management:    Induction: Intravenous  PONV Risk Score and Plan: 1 and Treatment may vary due to age or medical condition, Midazolam and Propofol infusion  Airway Management Planned: Simple Face Mask  Additional Equipment:   Intra-op Plan:   Post-operative Plan:   Informed Consent: I have reviewed the patients History and Physical, chart, labs and discussed the procedure including the risks, benefits and alternatives for the proposed anesthesia with the patient or authorized representative who has indicated his/her understanding and acceptance.       Plan Discussed with: CRNA and Surgeon  Anesthesia Plan Comments:         Anesthesia Quick Evaluation

## 2019-01-26 NOTE — ED Notes (Signed)
Pt transported to CT ?

## 2019-01-27 LAB — HIV ANTIBODY (ROUTINE TESTING W REFLEX): HIV Screen 4th Generation wRfx: NONREACTIVE

## 2019-01-29 ENCOUNTER — Encounter: Payer: Self-pay | Admitting: Gastroenterology

## 2019-01-29 ENCOUNTER — Encounter (HOSPITAL_COMMUNITY): Payer: Self-pay | Admitting: Gastroenterology

## 2019-03-11 ENCOUNTER — Emergency Department (HOSPITAL_COMMUNITY): Payer: Self-pay

## 2019-03-11 ENCOUNTER — Emergency Department (HOSPITAL_COMMUNITY)
Admission: EM | Admit: 2019-03-11 | Discharge: 2019-03-11 | Disposition: A | Discharge status: Home / Self Care | Payer: Self-pay | Attending: Emergency Medicine | Admitting: Emergency Medicine

## 2019-03-11 ENCOUNTER — Other Ambulatory Visit: Payer: Self-pay

## 2019-03-11 ENCOUNTER — Encounter (HOSPITAL_COMMUNITY): Payer: Self-pay

## 2019-03-11 DIAGNOSIS — J101 Influenza due to other identified influenza virus with other respiratory manifestations: Secondary | ICD-10-CM | POA: Insufficient documentation

## 2019-03-11 DIAGNOSIS — Z79899 Other long term (current) drug therapy: Secondary | ICD-10-CM | POA: Insufficient documentation

## 2019-03-11 HISTORY — DX: Pneumonia, unspecified organism: J18.9

## 2019-03-11 LAB — INFLUENZA PANEL BY PCR (TYPE A & B)
Influenza A By PCR: POSITIVE — AB
Influenza B By PCR: NEGATIVE

## 2019-03-11 MED ORDER — OSELTAMIVIR PHOSPHATE 75 MG PO CAPS: 75.0000 mg | ORAL_CAPSULE | Freq: Two times a day (BID) | ORAL | 0 refills | Status: AC

## 2019-03-11 MED ORDER — ACETAMINOPHEN 325 MG PO TABS
650.0000 mg | ORAL_TABLET | Freq: Once | ORAL | Status: AC | PRN
  Administered 2019-03-11: 650 mg via ORAL
  Filled 2019-03-11: qty 2

## 2019-03-11 NOTE — ED Provider Notes (Signed)
[facility]-EMERGENCY DEPT Provider Note   CSN: [id number] Arrival date & time: 03/11/19  1608    History   Chief Complaint Chief Complaint  Patient presents with  . Fever  . Cough    HPI Rodney Lester is a 58 y.o. male.     The history is provided by the patient.  Fever  Max temp prior to arrival:  101 Temp source:  Oral Severity:  Mild Onset quality:  Gradual Duration:  2 days Timing:  Intermittent Progression:  Waxing and waning Chronicity:  New Relieved by:  Nothing Worsened by:  Nothing Associated symptoms: chills, cough and myalgias   Associated symptoms: no chest pain, no confusion, no congestion, no dysuria, no ear pain, no rash, no rhinorrhea, no somnolence, no sore throat and no vomiting   Risk factors: sick contacts   Risk factors: no recent sickness and no recent travel     Past Medical History:  Diagnosis Date  . GERD (gastroesophageal reflux disease)   . Multiple gastric ulcers   . Pneumonia     Patient Active Problem List   Diagnosis Date Noted  . Abdominal pain 01/26/2019  . GERD with Reflux esophagitis 01/26/2019    Past Surgical History:  Procedure Laterality Date  . BIOPSY  01/26/2019   Procedure: BIOPSY;  Surgeon: Sherrilyn Rist, MD;  Location: [facility] ENDOSCOPY;  Service: Gastroenterology;;  . ESOPHAGOGASTRODUODENOSCOPY (EGD) WITH PROPOFOL N/A 01/26/2019   Procedure: ESOPHAGOGASTRODUODENOSCOPY (EGD) WITH PROPOFOL;  Surgeon: Sherrilyn Rist, MD;  Location: [facility] ENDOSCOPY;  Service: Gastroenterology;  Laterality: N/A;  . VEIN SURGERY  2002        Home Medications    Prior to Admission medications   Medication Sig Start Date End Date Taking? Authorizing Provider  acetaminophen (TYLENOL) 325 MG tablet Take 2 tablets (650 mg total) by mouth every 6 (six) hours as needed for mild pain (or Fever >/= 101). 01/26/19   Shon Hale, MD  calcium carbonate (TUMS) 500 MG chewable tablet Chew 1 tablet (200 mg of  elemental calcium total) by mouth 2 (two) times daily as needed for indigestion or heartburn. 01/26/19 01/26/20  Shon Hale, MD  ondansetron (ZOFRAN) 4 MG tablet Take 1 tablet (4 mg total) by mouth every 6 (six) hours as needed for nausea. 01/26/19   Shon Hale, MD  oseltamivir (TAMIFLU) 75 MG capsule Take 1 capsule (75 mg total) by mouth every 12 (twelve) hours for 5 days. 03/11/19 03/16/19  Dari Carpenito, DO  pantoprazole (PROTONIX) 40 MG tablet Take 1 tablet (40 mg total) by mouth 2 (two) times daily before a meal. For 4 weeks, then once daily after that 01/26/19   Shon Hale, MD    Family History History reviewed. No pertinent family history.  Social History Social History   Tobacco Use  . Smoking status: Never Smoker  . Smokeless tobacco: Never Used  Substance Use Topics  . Alcohol use: No    Frequency: Never  . Drug use: No     Allergies   Patient has no known allergies.   Review of Systems Review of Systems  Constitutional: Positive for chills and fever.  HENT: Negative for congestion, ear pain, rhinorrhea and sore throat.   Eyes: Negative for pain and visual disturbance.  Respiratory: Positive for cough. Negative for apnea, chest tightness, shortness of breath and wheezing.   Cardiovascular: Negative for chest pain and palpitations.  Gastrointestinal: Negative for abdominal pain and vomiting.  Genitourinary: Negative for dysuria and  hematuria.  Musculoskeletal: Positive for myalgias. Negative for arthralgias and back pain.  Skin: Negative for color change and rash.  Neurological: Negative for seizures and syncope.  Psychiatric/Behavioral: Negative for confusion.  All other systems reviewed and are negative.    Physical Exam  ED Triage Vitals [03/11/19 1624]  Enc Vitals Group     BP 133/81     Pulse Rate 88     Resp 18     Temp (!) 102 F (38.9 C)     Temp Source Oral     SpO2 99 %     Weight      Height      Head Circumference      Peak Flow       Pain Score 0     Pain Loc      Pain Edu?      Excl. in GC?      Physical Exam Vitals signs and nursing note reviewed.  Constitutional:      General: He is not in acute distress.    Appearance: He is well-developed. He is not ill-appearing.  HENT:     Head: Normocephalic and atraumatic.     Nose: Nose normal.     Mouth/Throat:     Mouth: Mucous membranes are moist.  Eyes:     Extraocular Movements: Extraocular movements intact.     Conjunctiva/sclera: Conjunctivae normal.     Pupils: Pupils are equal, round, and reactive to light.  Neck:     Musculoskeletal: Normal range of motion and neck supple.  Cardiovascular:     Rate and Rhythm: Normal rate and regular rhythm.     Pulses: Normal pulses.     Heart sounds: Normal heart sounds. No murmur.  Pulmonary:     Effort: Pulmonary effort is normal. No respiratory distress.     Breath sounds: Normal breath sounds.  Abdominal:     General: There is no distension.     Palpations: Abdomen is soft.     Tenderness: There is no abdominal tenderness.  Musculoskeletal: Normal range of motion.  Skin:    General: Skin is warm and dry.     Capillary Refill: Capillary refill takes less than 2 seconds.  Neurological:     General: No focal deficit present.     Mental Status: He is alert.      ED Treatments / Results  Labs (all labs ordered are listed, but only abnormal results are displayed) Labs Reviewed  INFLUENZA PANEL BY PCR (TYPE A & B) - Abnormal; Notable for the following components:      Result Value   Influenza A By PCR POSITIVE (*)    All other components within normal limits    EKG None  Radiology Dg Chest 2 View  Result Date: 03/11/2019 CLINICAL DATA:  Acute onset of cough and fever that began yesterday. Chest pain and back pain when coughing. EXAM: CHEST - 2 VIEW COMPARISON:  Chest x-ray and CT chest 11/25/2017. FINDINGS: Cardiomediastinal silhouette unremarkable and unchanged. Lungs clear. Bronchovascular  markings normal. Pulmonary vascularity normal. No visible pleural effusions. No pneumothorax. Minimal degenerative changes involving the thoracic spine. IMPRESSION: No acute cardiopulmonary disease. Electronically Signed   By: Hulan Saas M.D.   On: 03/11/2019 16:49    Procedures Procedures (including critical care time)  Medications Ordered in ED Medications  acetaminophen (TYLENOL) tablet 650 mg (650 mg Oral Given 03/11/19 1635)     Initial Impression / Assessment and Plan / ED Course  I have reviewed the triage vital signs and the nursing notes.  Pertinent labs & imaging results that were available during my care of the patient were reviewed by me and considered in my medical decision making (see chart for details).     LINDY YELINEK is a 58 year old male with no significant medical history who presents to the ED with fever.  Patient with normal vitals except for fever of 102.  Patient with cough, body aches for the last 2 days.  Did not get a flu shot this year.  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  No recent travel.  No coronavirus exposure that he is aware of.  Overall patient appears well.  Has clear breath sounds.  Chest x-ray showed no signs of pneumonia, pneumothorax, pleural effusion.  Patient denies any urinary symptoms.  No abdominal pain.  Influenza testing is ordered and was positive for influenza A.  Patient given prescription for Tamiflu.  Educated about viral processes.  Unlikely patient has co-exposure to coronavirus.  Recommend self-care at home for several days.  Given information to follow-up with primary care doctor and told her return to the ED symptoms worsen.  This chart was dictated using voice recognition software.  Despite best efforts to proofread,  errors can occur which can change the documentation meaning.    Final Clinical Impressions(s) / ED Diagnoses   Final diagnoses:  Influenza A    ED Discharge Orders          Ordered    oseltamivir (TAMIFLU) 75 MG capsule  Every 12 hours     03/11/19 1826           Virgina Norfolk, DO 03/11/19 1828

## 2019-03-11 NOTE — ED Triage Notes (Signed)
Pt reports cough and fever since yesterday. Pt reports that when he coughs his chest and back hurt. Pt went to clinic before coming here and they sent him here because he has a hx of pneumonia and they were worried he may have it again. Pt reports being around someone on Saturday that had a cough and started feeling bad the next day. Pt is Spanish speaking and needs interpreter. Pt has fever in triage but reports taking Aleve for fever about an hour ago.

## 2020-07-01 ENCOUNTER — Telehealth: Payer: Self-pay | Admitting: Clinical

## 2020-07-01 NOTE — Telephone Encounter (Signed)
LCSW contacted to check in if still interested in counseling. LCSW informed of fees. Patient reports he will call back after he asks employer if able to take off to see LCSW. Patient asked how many session, LCSW informed there is no timeline it all depends if feeling better/treatment goals met. [phone]

## 2020-09-16 IMAGING — CT CT ABD-PELV W/ CM
2 of 5 series · 17 of 46 positions shown, 19 images · IV contrast (omnipaque)
Comparison: Prior ultrasound from 12/26/2008.

CLINICAL DATA: Initial evaluation for acute sharp left upper
quadrant pain, nausea, vomiting.

EXAM:
CT ABDOMEN AND PELVIS WITH CONTRAST
TECHNIQUE: Multidetector CT imaging of the abdomen and pelvis was performed
using the standard protocol following bolus administration of
intravenous contrast.
CONTRAST:  100mL OMNIPAQUE IOHEXOL 300 MG/ML  SOLN

[Series 3: a/p w/ 5mm · axial · 0.90mm/px · z∈[+756,+1181]mm · 14 of 97 slices shown, 16 images]
[im 6/97  soft-tissue]
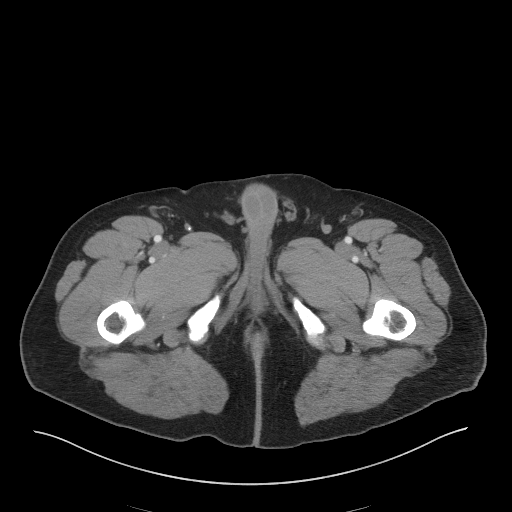
[im 6/97  bone]
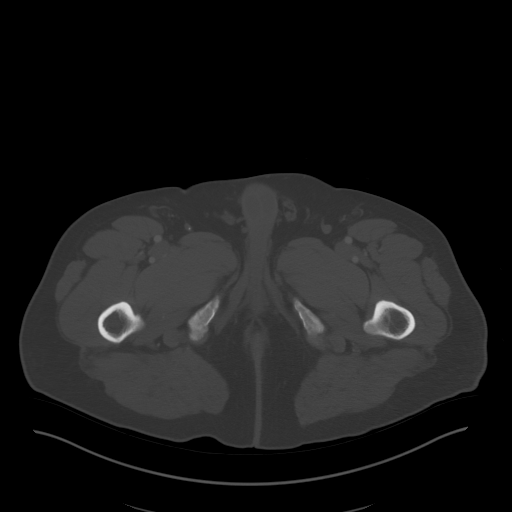
[im 11/97  soft-tissue]
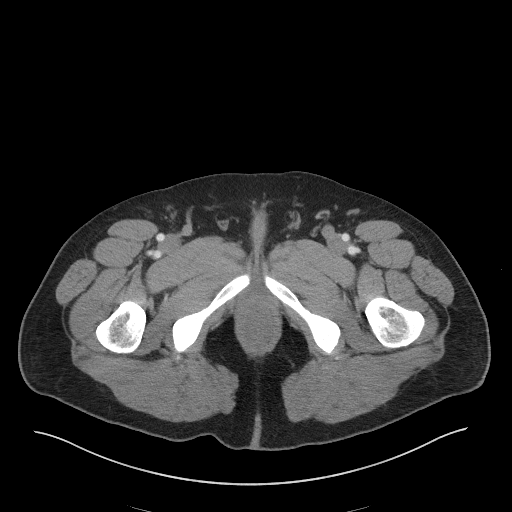
[im 21/97  soft-tissue]
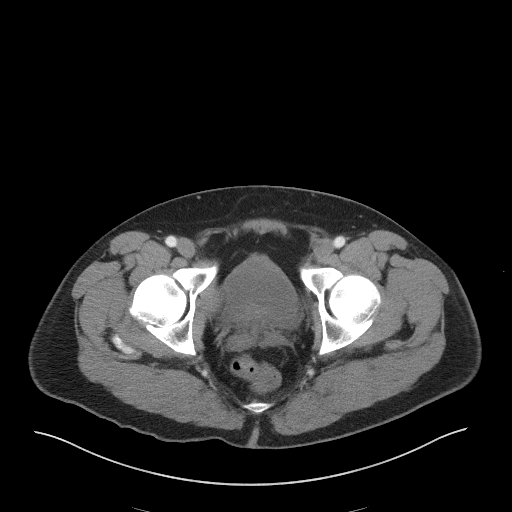
[im 26/97  soft-tissue]
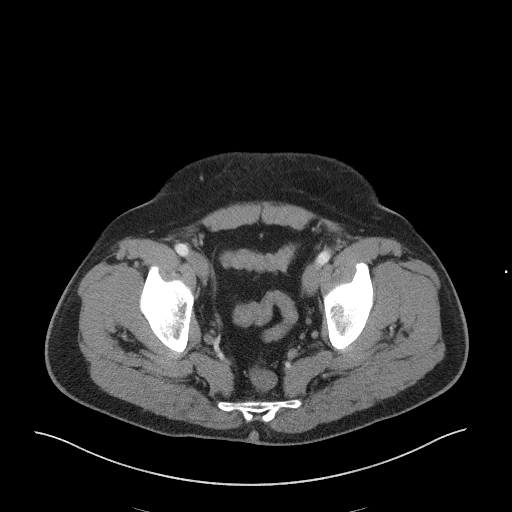
[im 31/97  soft-tissue]
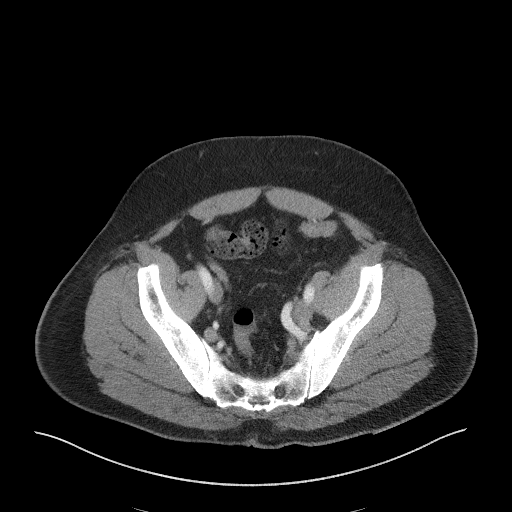
[im 41/97  soft-tissue]
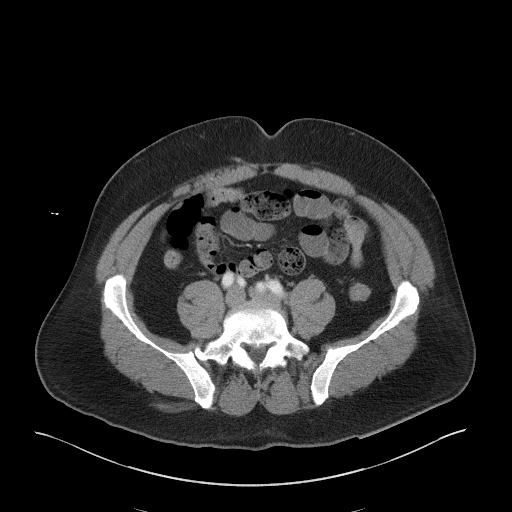
[im 46/97  soft-tissue]
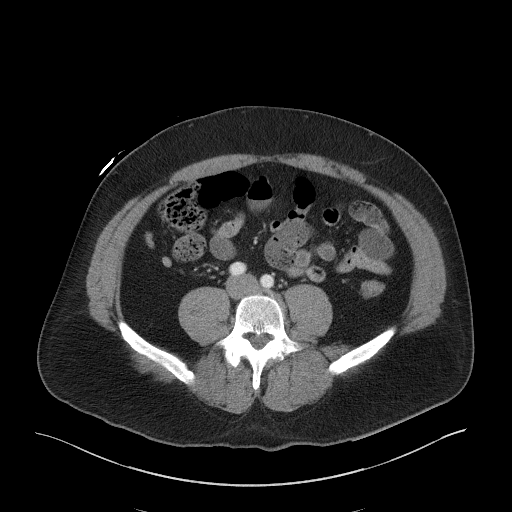
[im 51/97  soft-tissue]
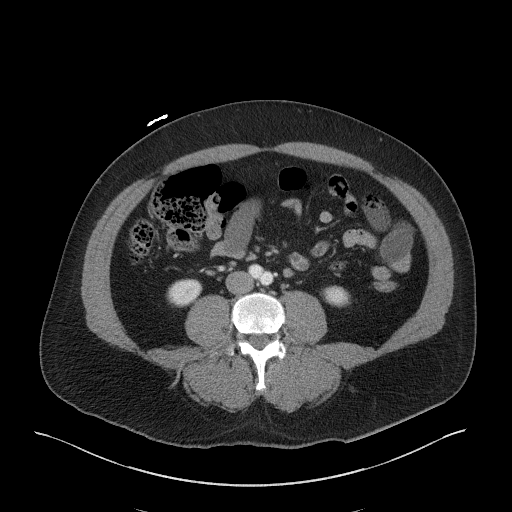
[im 56/97  soft-tissue]
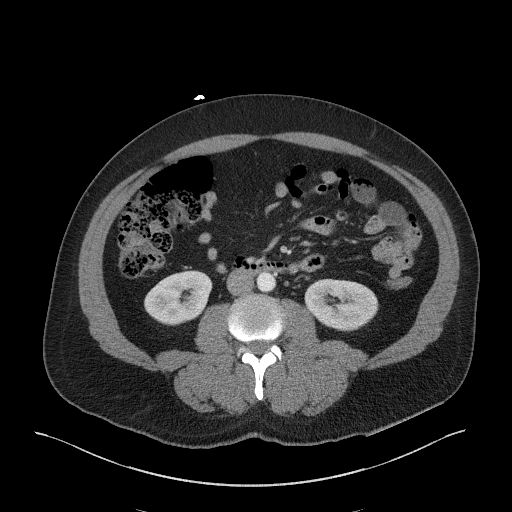
[im 56/97  bone]
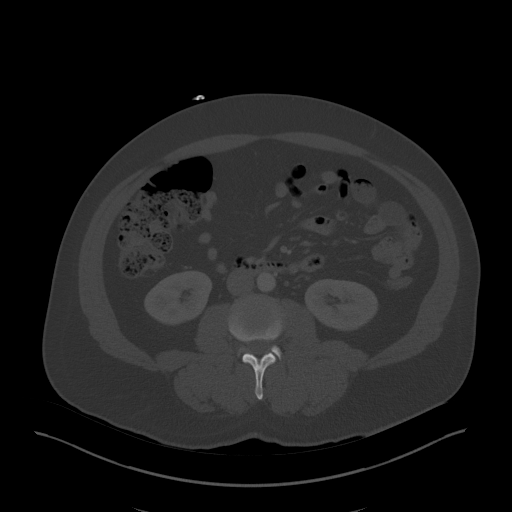
[im 66/97  soft-tissue]
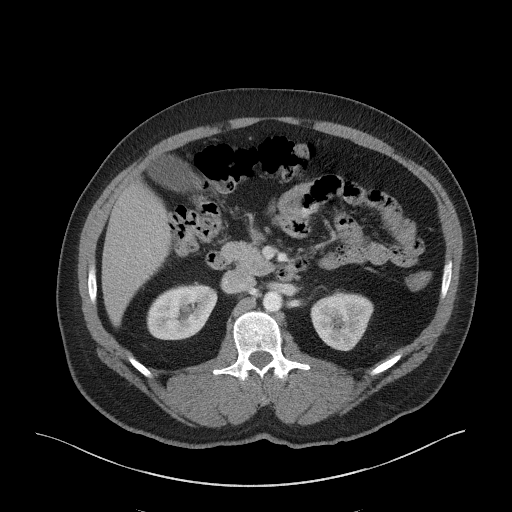
[im 71/97  soft-tissue]
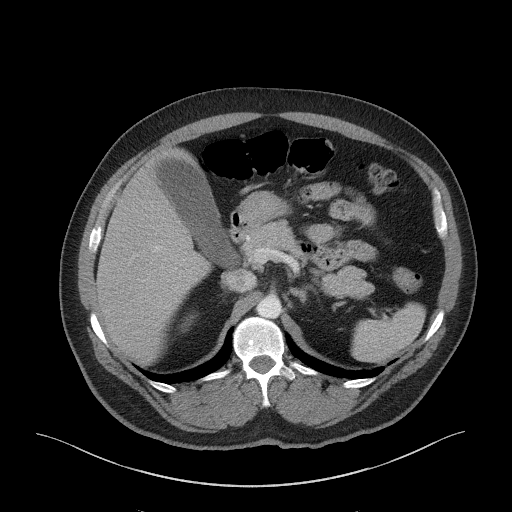
[im 76/97  soft-tissue]
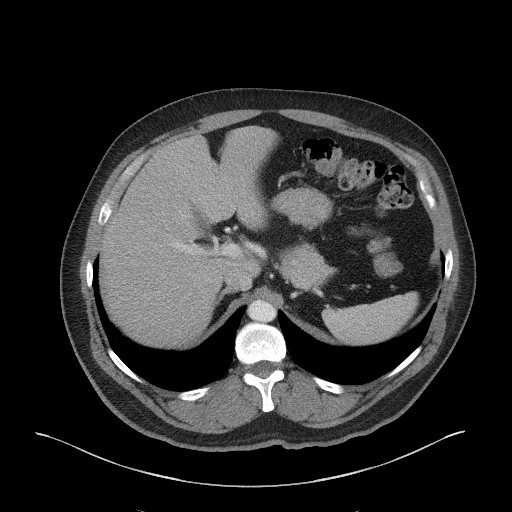
[im 86/97  soft-tissue]
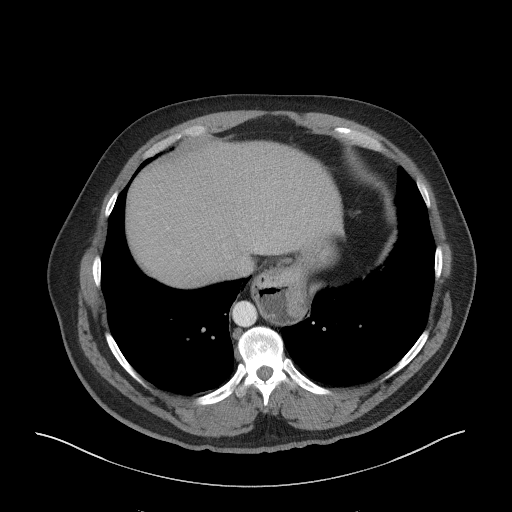
[im 91/97  soft-tissue]
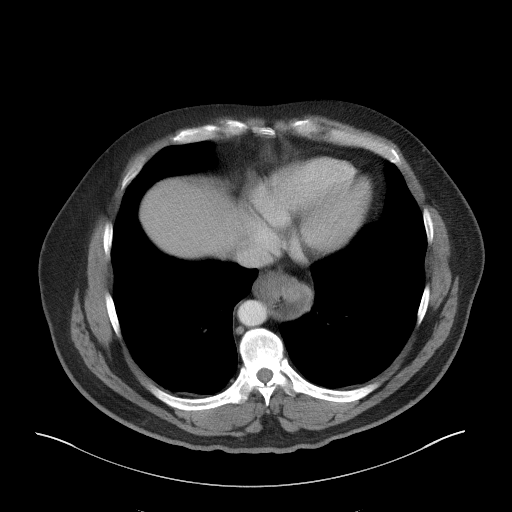

[Series 6: a/p w/ cor · coronal · 0.89mm/px · 3 of 180 slices shown]
[im 60/180  soft-tissue]
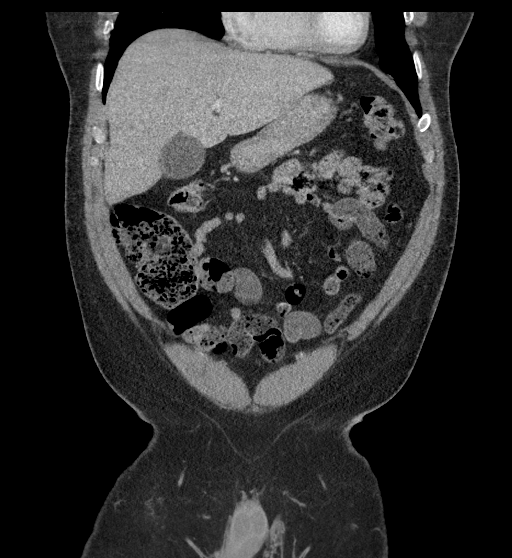
[im 80/180  soft-tissue]
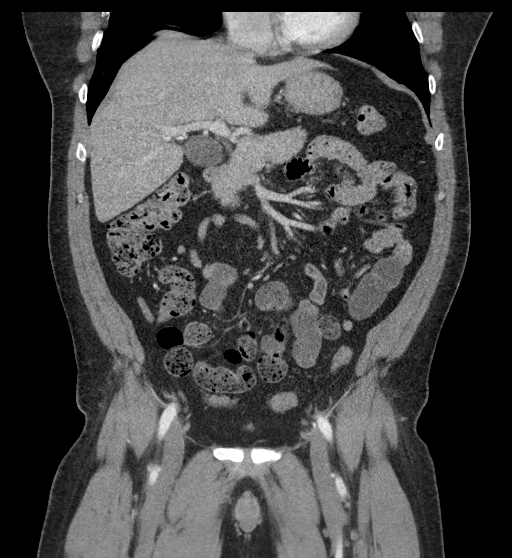
[im 100/180  soft-tissue]
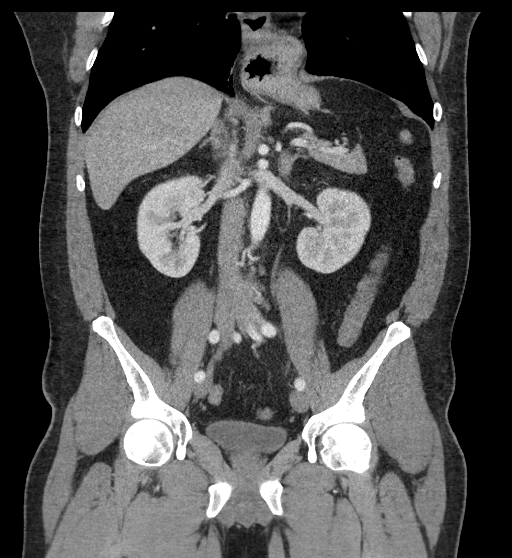

[17 of 46 positions shown; findings below may reference images not displayed]

FINDINGS: Lower chest: Visualized lung bases are clear.

Hepatobiliary: Liver demonstrates a normal contrast enhanced
appearance. Gallbladder within normal limits. No biliary dilatation.

Pancreas: Pancreas within normal limits. No acute peripancreatic
inflammation to suggest pancreatitis. No pancreatic mass or abnormal
pancreatic ductal dilatation.

Spleen: Unremarkable.

Adrenals/Urinary Tract: Adrenal glands are normal. Kidneys equal in
size with symmetric enhancement. No nephrolithiasis, hydronephrosis
or focal enhancing renal mass. No hydroureter. Bladder within normal
limits.

Stomach/Bowel: Moderate sized hiatal hernia. Stomach otherwise
decompressed without acute finding. No evidence for bowel
obstruction. Normal appendix. No acute inflammatory changes seen
about the bowels.

Vascular/Lymphatic: Normal intravascular enhancement seen throughout
the intra-abdominal aorta. Mesenteric vessels patent proximally. No
adenopathy.

Reproductive: Prostate normal.

Other: No free air or fluid.

Musculoskeletal: No acute osseous finding. No discrete lytic or
blastic osseous lesions.
IMPRESSION: No CT evidence for acute intra-abdominal or pelvic process. No
findings to explain patient's symptoms identified.

## 2020-10-30 IMAGING — CR CHEST - 2 VIEW
2 series · 2 of 2 positions shown · non-contrast
Comparison: Chest x-ray and CT chest 11/25/2017.

CLINICAL DATA: Acute onset of cough and fever that began yesterday.
Chest pain and back pain when coughing.

EXAM:
CHEST - 2 VIEW

[w chest pa]
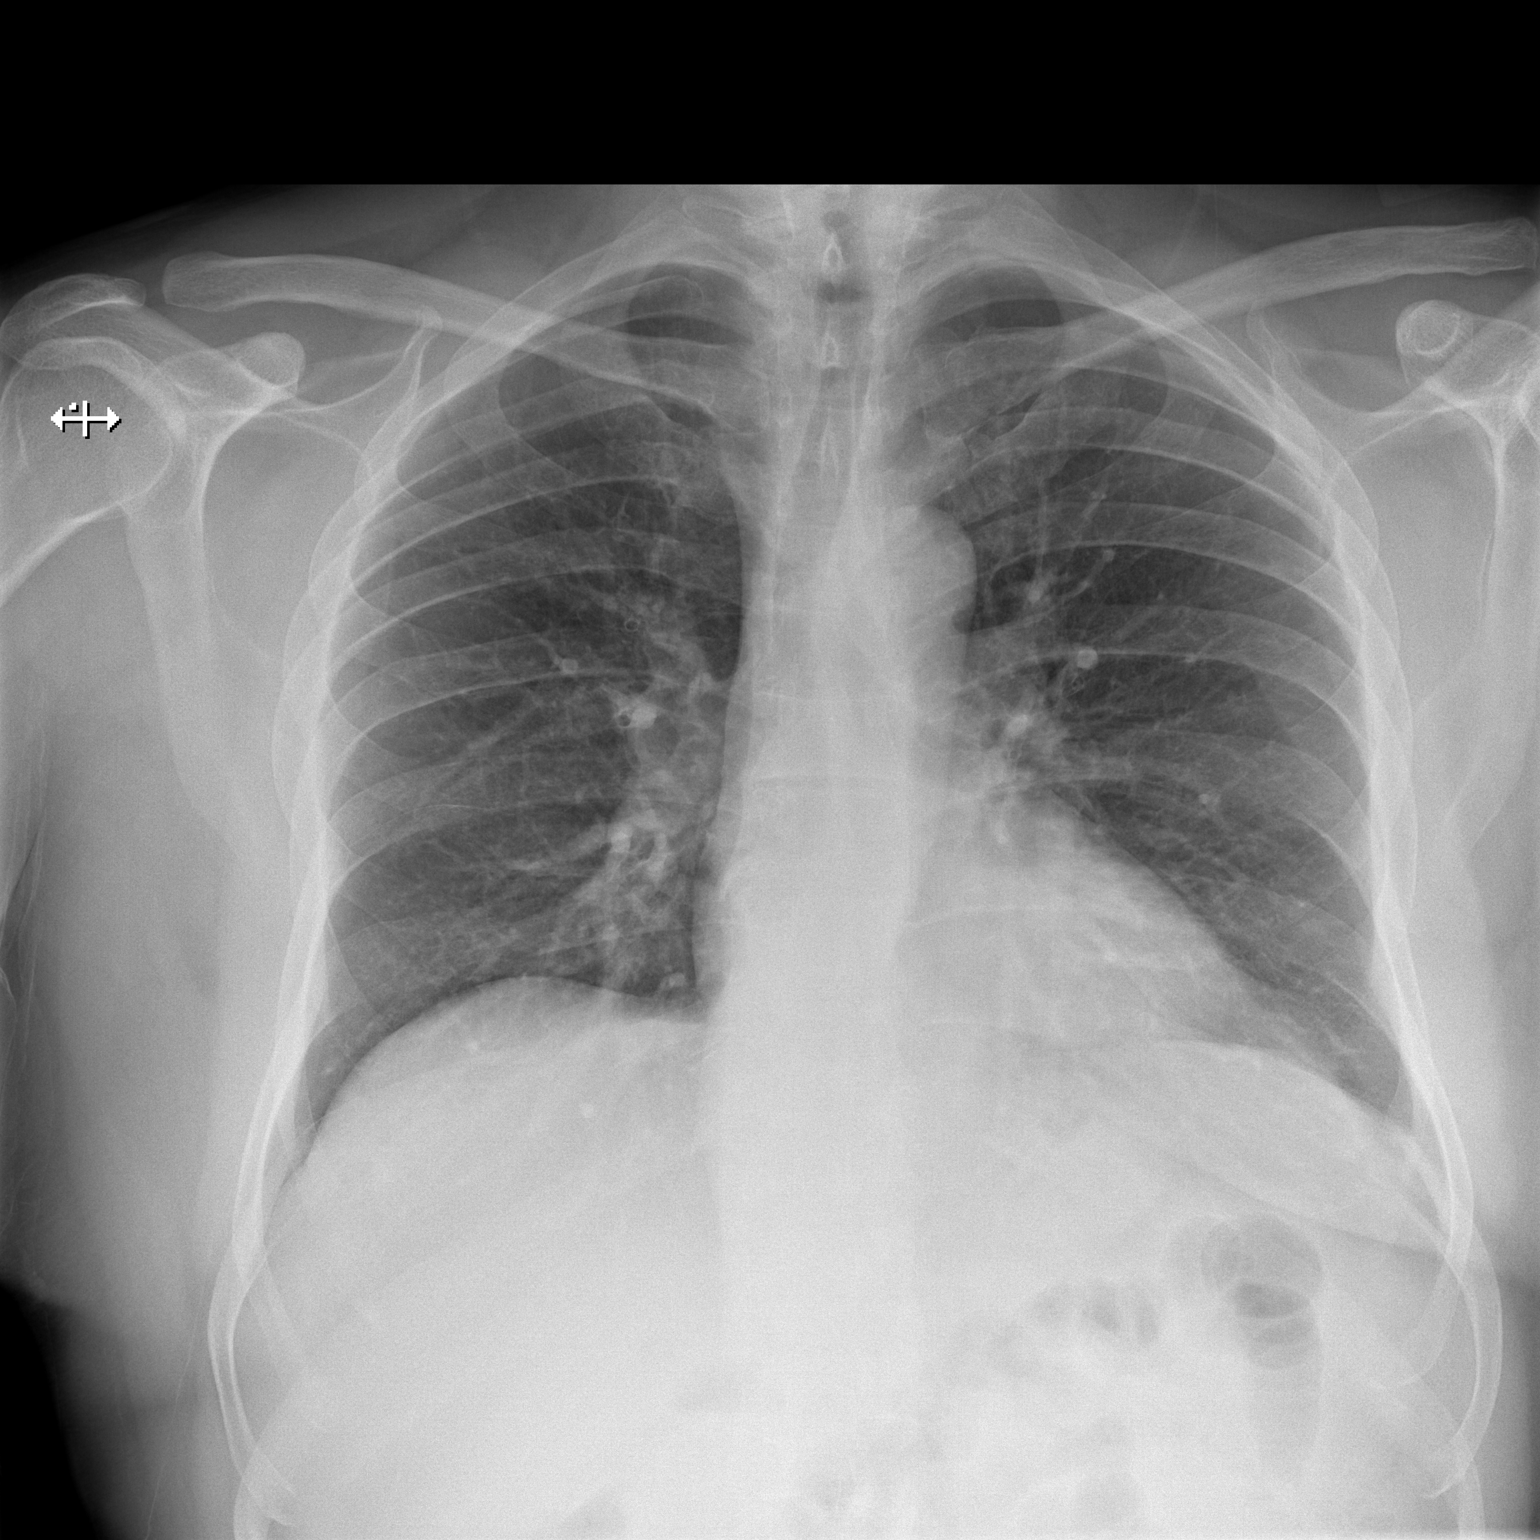

[w chest lat]
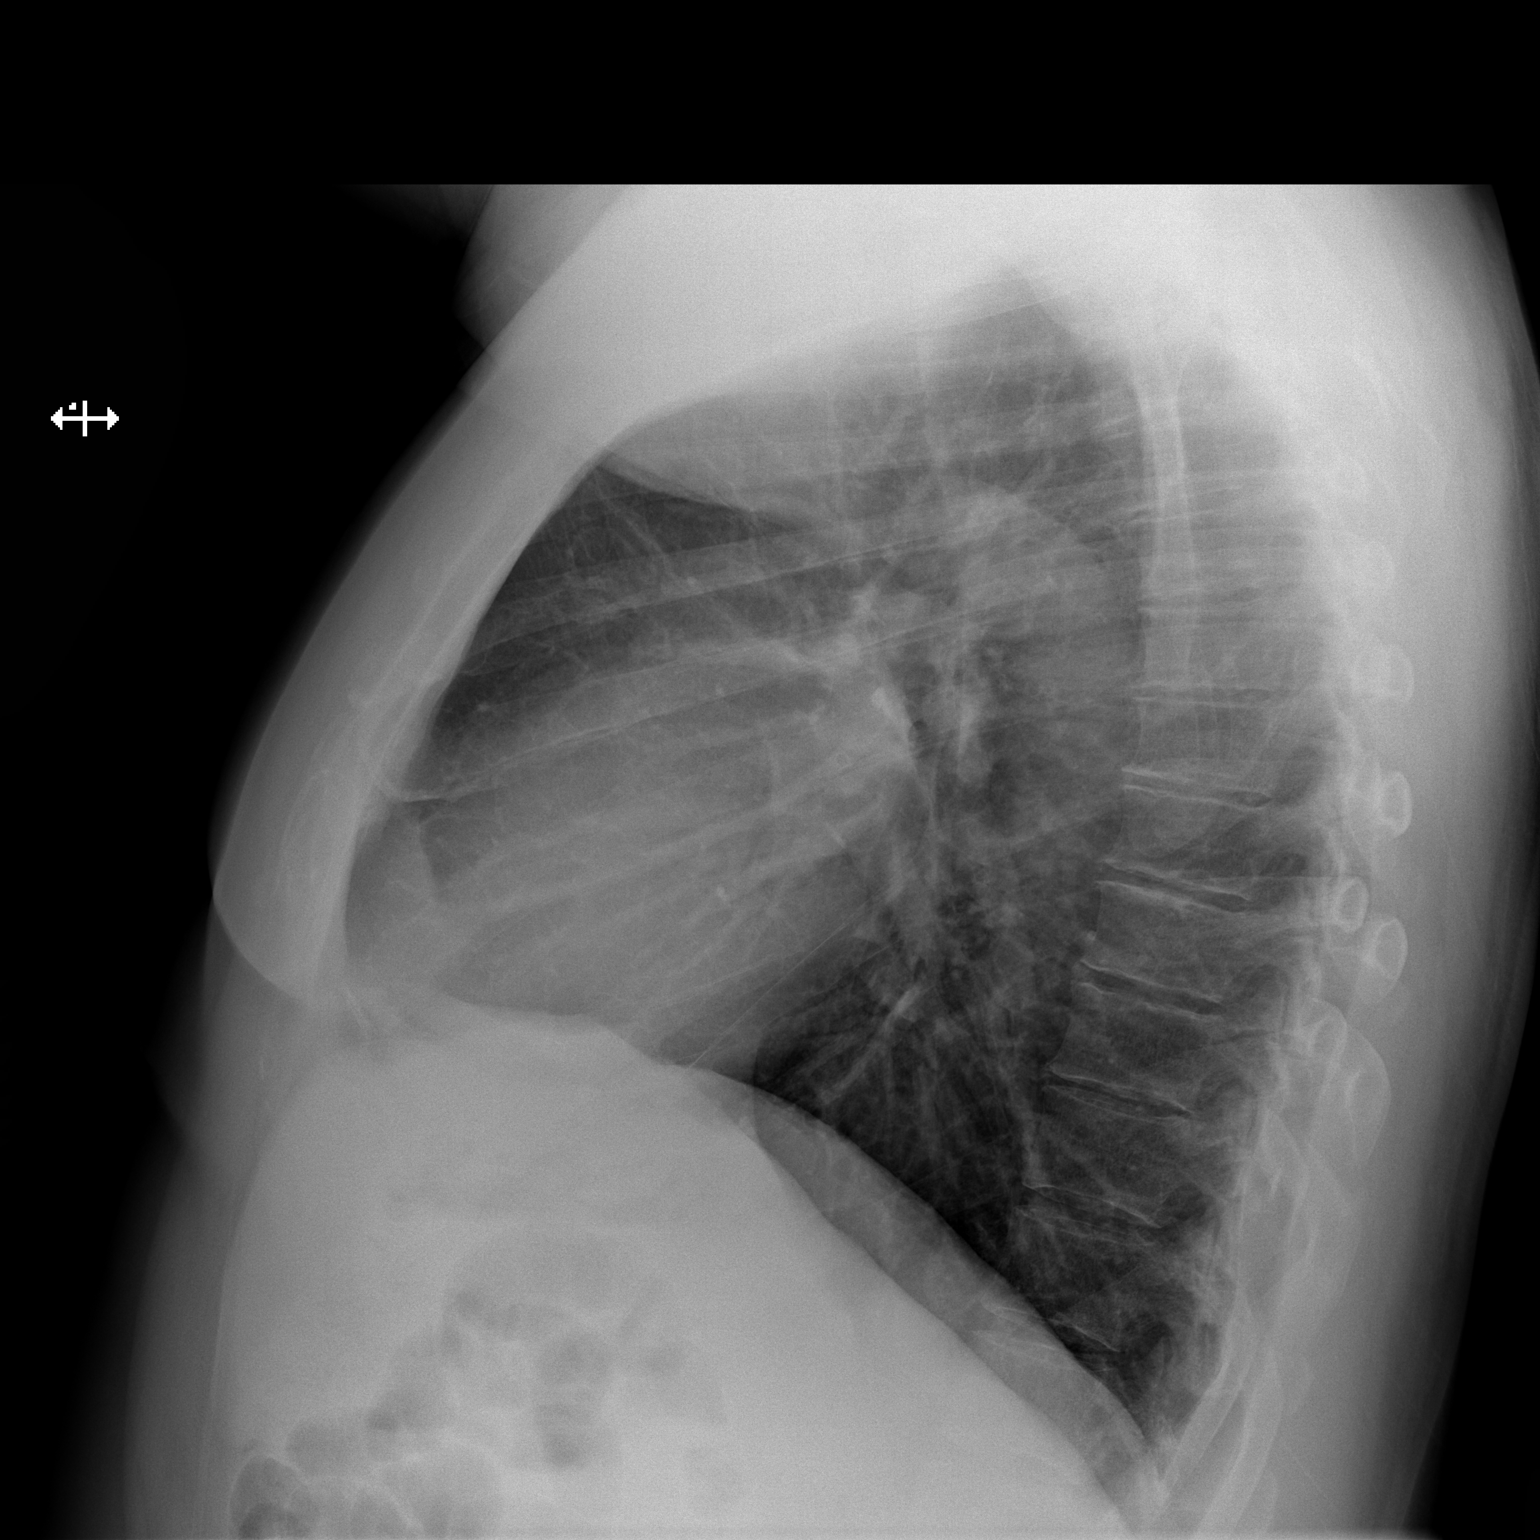

[2 of 2 positions shown; findings below may reference images not displayed]

FINDINGS: Cardiomediastinal silhouette unremarkable and unchanged. Lungs
clear. Bronchovascular markings normal. Pulmonary vascularity
normal. No visible pleural effusions. No pneumothorax. Minimal
degenerative changes involving the thoracic spine.
IMPRESSION: No acute cardiopulmonary disease.
